# Patient Record
Sex: Female | Born: 1975 | Race: Asian | Hispanic: Yes | Marital: Married | Smoking: Never smoker
Health system: Southern US, Community
[De-identification: ages and names within clinical notes are randomized; demographics above are authoritative.]

## PROBLEM LIST (undated history)

## (undated) DIAGNOSIS — R002 Palpitations: Secondary | ICD-10-CM

## (undated) DIAGNOSIS — N920 Excessive and frequent menstruation with regular cycle: Secondary | ICD-10-CM

## (undated) DIAGNOSIS — E039 Hypothyroidism, unspecified: Secondary | ICD-10-CM

## (undated) DIAGNOSIS — E282 Polycystic ovarian syndrome: Secondary | ICD-10-CM

## (undated) DIAGNOSIS — E119 Type 2 diabetes mellitus without complications: Secondary | ICD-10-CM

## (undated) DIAGNOSIS — F32A Depression, unspecified: Secondary | ICD-10-CM

## (undated) DIAGNOSIS — F329 Major depressive disorder, single episode, unspecified: Secondary | ICD-10-CM

## (undated) DIAGNOSIS — Z9289 Personal history of other medical treatment: Secondary | ICD-10-CM

## (undated) DIAGNOSIS — D649 Anemia, unspecified: Secondary | ICD-10-CM

---

## 2012-03-14 ENCOUNTER — Ambulatory Visit (INDEPENDENT_AMBULATORY_CARE_PROVIDER_SITE_OTHER): Payer: 59 | Admitting: Internal Medicine

## 2012-03-14 VITALS — BP 113/74 | HR 84 | Temp 97.8°F | Resp 16 | Ht 63.75 in | Wt 221.6 lb

## 2012-03-14 DIAGNOSIS — N979 Female infertility, unspecified: Secondary | ICD-10-CM

## 2012-03-14 DIAGNOSIS — E039 Hypothyroidism, unspecified: Secondary | ICD-10-CM

## 2012-03-14 DIAGNOSIS — Z0000 Encounter for general adult medical examination without abnormal findings: Secondary | ICD-10-CM

## 2012-03-14 DIAGNOSIS — E282 Polycystic ovarian syndrome: Secondary | ICD-10-CM

## 2012-03-14 LAB — POCT CBC
Granulocyte percent: 55.9 %G (ref 37–80)
HCT, POC: 44.8 % (ref 37.7–47.9)
MCV: 90.6 fL (ref 80–97)
MID (cbc): 0.7 (ref 0–0.9)
Platelet Count, POC: 314 10*3/uL (ref 142–424)
RBC: 4.94 M/uL (ref 4.04–5.48)

## 2012-03-14 NOTE — Progress Notes (Signed)
  Subjective:    Patient ID: Veronica Kelly, female    DOB: 02/02/1976, 36 y.o.   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  HENT: Negative for hearing loss, congestion and dental problem.   Eyes: Negative for visual disturbance.  Respiratory: Negative for shortness of breath.   Cardiovascular: Negative.   Gastrointestinal: Negative.   Genitourinary: Negative for frequency and difficulty urinating.  Musculoskeletal: Negative for myalgias, joint swelling and gait problem.  Skin: Negative for rash.  Neurological: Negative.   Psychiatric/Behavioral: Negative.        Objective:   Physical Exam  Constitutional: She is oriented to person, place, and time.       In no acute distress Obese ---------------------------              03/14/12                     1742        ---------------------------  BP:          113/74        Pulse:         84          Temp:   97.8 F (36.6 C)  Resp:          16         ---------------------------   HENT:  Head: Normocephalic.  Right Ear: External ear normal.  Left Ear: External ear normal.  Nose: Nose normal.  Mouth/Throat: Oropharynx is clear and moist.  Eyes: Conjunctivae and EOM are normal. Pupils are equal, round, and reactive to light.  Neck: Neck supple. No thyromegaly present.  Cardiovascular: Normal rate, regular rhythm, normal heart sounds and intact distal pulses.   No murmur  heard. Pulmonary/Chest: Effort normal and breath sounds normal.  Abdominal: Soft. She exhibits no mass.  Musculoskeletal: Normal range of motion. She exhibits no edema.  Neurological: She is alert and oriented to person, place, and time. She has normal reflexes.  Skin: No rash noted.  Psychiatric: She has a normal mood and affect.       Assessment & Plan:  Impression #1 except for obesity her general health is good 1. Infertility, female  Progesterone  2. Hypothyroid  Lipid panel, TSH, T4, Free, POCT CBC, Comprehensive metabolic panel  3. PCOS (polycystic ovarian syndrome)  Lipid panel, Comprehensive metabolic panel   We'll contact with lab results Continue infertility workup

## 2012-03-15 LAB — COMPREHENSIVE METABOLIC PANEL
Albumin: 4.1 g/dL (ref 3.5–5.2)
Alkaline Phosphatase: 88 U/L (ref 39–117)
Glucose, Bld: 96 mg/dL (ref 70–99)
Potassium: 4.2 mEq/L (ref 3.5–5.3)
Sodium: 139 mEq/L (ref 135–145)
Total Protein: 7 g/dL (ref 6.0–8.3)

## 2012-03-15 LAB — LIPID PANEL
LDL Cholesterol: 60 mg/dL (ref 0–99)
Total CHOL/HDL Ratio: 4 Ratio
VLDL: 34 mg/dL (ref 0–40)

## 2012-03-15 LAB — T4, FREE: Free T4: 1.26 ng/dL (ref 0.80–1.80)

## 2012-03-15 LAB — TSH: TSH: 1.728 u[IU]/mL (ref 0.350–4.500)

## 2012-03-17 ENCOUNTER — Encounter: Payer: Self-pay | Admitting: Internal Medicine

## 2012-06-06 LAB — OB RESULTS CONSOLE RUBELLA ANTIBODY, IGM: Rubella: IMMUNE

## 2012-06-06 LAB — OB RESULTS CONSOLE ANTIBODY SCREEN: Antibody Screen: NEGATIVE

## 2012-06-06 LAB — OB RESULTS CONSOLE HIV ANTIBODY (ROUTINE TESTING): HIV: NONREACTIVE

## 2012-06-06 LAB — OB RESULTS CONSOLE RPR: RPR: NONREACTIVE

## 2012-06-16 ENCOUNTER — Inpatient Hospital Stay (HOSPITAL_COMMUNITY): Admission: AD | Admit: 2012-06-16 | Payer: Self-pay | Source: Ambulatory Visit | Admitting: Obstetrics & Gynecology

## 2012-06-16 LAB — OB RESULTS CONSOLE GC/CHLAMYDIA: Gonorrhea: NEGATIVE

## 2012-10-14 LAB — OB RESULTS CONSOLE RPR: RPR: NONREACTIVE

## 2012-12-14 ENCOUNTER — Inpatient Hospital Stay (HOSPITAL_COMMUNITY): Payer: 59

## 2012-12-14 ENCOUNTER — Inpatient Hospital Stay (HOSPITAL_COMMUNITY)
Admission: AD | Admit: 2012-12-14 | Discharge: 2012-12-19 | DRG: 766 | Disposition: A | Discharge status: Home / Self Care | Payer: 59 | Source: Ambulatory Visit | Attending: Obstetrics and Gynecology | Admitting: Obstetrics and Gynecology

## 2012-12-14 ENCOUNTER — Encounter (HOSPITAL_COMMUNITY): Payer: Self-pay | Admitting: *Deleted

## 2012-12-14 DIAGNOSIS — O4290 Premature rupture of membranes, unspecified as to length of time between rupture and onset of labor, unspecified weeks of gestation: Secondary | ICD-10-CM | POA: Diagnosis present

## 2012-12-14 DIAGNOSIS — E039 Hypothyroidism, unspecified: Secondary | ICD-10-CM | POA: Diagnosis present

## 2012-12-14 DIAGNOSIS — Z98891 History of uterine scar from previous surgery: Secondary | ICD-10-CM

## 2012-12-14 DIAGNOSIS — E079 Disorder of thyroid, unspecified: Secondary | ICD-10-CM | POA: Diagnosis present

## 2012-12-14 DIAGNOSIS — O24419 Gestational diabetes mellitus in pregnancy, unspecified control: Secondary | ICD-10-CM | POA: Diagnosis present

## 2012-12-14 DIAGNOSIS — O99814 Abnormal glucose complicating childbirth: Secondary | ICD-10-CM | POA: Diagnosis present

## 2012-12-14 DIAGNOSIS — O324XX0 Maternal care for high head at term, not applicable or unspecified: Secondary | ICD-10-CM | POA: Diagnosis present

## 2012-12-14 DIAGNOSIS — E669 Obesity, unspecified: Secondary | ICD-10-CM | POA: Diagnosis present

## 2012-12-14 DIAGNOSIS — O09519 Supervision of elderly primigravida, unspecified trimester: Secondary | ICD-10-CM | POA: Diagnosis present

## 2012-12-14 DIAGNOSIS — O99284 Endocrine, nutritional and metabolic diseases complicating childbirth: Secondary | ICD-10-CM | POA: Diagnosis present

## 2012-12-14 HISTORY — DX: Hypothyroidism, unspecified: E03.9

## 2012-12-14 HISTORY — DX: Polycystic ovarian syndrome: E28.2

## 2012-12-14 HISTORY — DX: Depression, unspecified: F32.A

## 2012-12-14 HISTORY — DX: Major depressive disorder, single episode, unspecified: F32.9

## 2012-12-14 LAB — CBC
Platelets: 251 10*3/uL (ref 150–400)
RBC: 4.52 MIL/uL (ref 3.87–5.11)
RDW: 15.1 % (ref 11.5–15.5)
WBC: 9.1 10*3/uL (ref 4.0–10.5)

## 2012-12-14 LAB — GLUCOSE, CAPILLARY
Glucose-Capillary: 102 mg/dL — ABNORMAL HIGH (ref 70–99)
Glucose-Capillary: 62 mg/dL — ABNORMAL LOW (ref 70–99)
Glucose-Capillary: 80 mg/dL (ref 70–99)
Glucose-Capillary: 80 mg/dL (ref 70–99)

## 2012-12-14 LAB — RPR: RPR Ser Ql: NONREACTIVE

## 2012-12-14 MED ORDER — FLEET ENEMA 7-19 GM/118ML RE ENEM: 1.0000 | ENEMA | Freq: Once | RECTAL | Status: DC

## 2012-12-14 MED ORDER — LACTATED RINGERS IV SOLN: 500.0000 mL | INTRAVENOUS | Status: DC | PRN

## 2012-12-14 MED ORDER — OXYTOCIN BOLUS FROM INFUSION: 500.0000 mL | INTRAVENOUS | Status: DC

## 2012-12-14 MED ORDER — EPHEDRINE 5 MG/ML INJ: 10.0000 mg | INTRAVENOUS | Status: DC | PRN

## 2012-12-14 MED ORDER — LACTATED RINGERS IV SOLN
INTRAVENOUS | Status: DC
  Administered 2012-12-14 (×6): via INTRAVENOUS

## 2012-12-14 MED ORDER — PHENYLEPHRINE 40 MCG/ML (10ML) SYRINGE FOR IV PUSH (FOR BLOOD PRESSURE SUPPORT)
80.0000 ug | PREFILLED_SYRINGE | INTRAVENOUS | Status: DC | PRN
  Filled 2012-12-14: qty 5

## 2012-12-14 MED ORDER — OXYTOCIN 40 UNITS IN LACTATED RINGERS INFUSION - SIMPLE MED: 62.5000 mL/h | INTRAVENOUS | Status: DC

## 2012-12-14 MED ORDER — BUTORPHANOL TARTRATE 1 MG/ML IJ SOLN
1.0000 mg | INTRAMUSCULAR | Status: DC | PRN
  Administered 2012-12-14 (×2): 1 mg via INTRAVENOUS
  Filled 2012-12-14 (×2): qty 1

## 2012-12-14 MED ORDER — LACTATED RINGERS IV SOLN: 500.0000 mL | Freq: Once | INTRAVENOUS | Status: DC

## 2012-12-14 MED ORDER — LIDOCAINE HCL (PF) 1 % IJ SOLN
INTRAMUSCULAR | Status: DC | PRN
  Administered 2012-12-14 (×4): 4 mL

## 2012-12-14 MED ORDER — IBUPROFEN 600 MG PO TABS: 600.0000 mg | ORAL_TABLET | Freq: Four times a day (QID) | ORAL | Status: DC | PRN

## 2012-12-14 MED ORDER — ACETAMINOPHEN 325 MG PO TABS: 650.0000 mg | ORAL_TABLET | ORAL | Status: DC | PRN

## 2012-12-14 MED ORDER — MISOPROSTOL 25 MCG QUARTER TABLET
50.0000 ug | ORAL_TABLET | Freq: Once | ORAL | Status: AC
  Administered 2012-12-14: 50 ug via ORAL
  Filled 2012-12-14: qty 0.5

## 2012-12-14 MED ORDER — ONDANSETRON HCL 4 MG/2ML IJ SOLN: 4.0000 mg | Freq: Four times a day (QID) | INTRAMUSCULAR | Status: DC | PRN

## 2012-12-14 MED ORDER — DIPHENHYDRAMINE HCL 50 MG/ML IJ SOLN: 12.5000 mg | INTRAMUSCULAR | Status: DC | PRN

## 2012-12-14 MED ORDER — LIDOCAINE HCL (PF) 1 % IJ SOLN: 30.0000 mL | INTRAMUSCULAR | Status: DC | PRN

## 2012-12-14 MED ORDER — FENTANYL 2.5 MCG/ML BUPIVACAINE 1/10 % EPIDURAL INFUSION (WH - ANES)
14.0000 mL/h | INTRAMUSCULAR | Status: DC | PRN
  Administered 2012-12-14 (×2): 14 mL/h via EPIDURAL
  Filled 2012-12-14 (×2): qty 125

## 2012-12-14 MED ORDER — CITRIC ACID-SODIUM CITRATE 334-500 MG/5ML PO SOLN
30.0000 mL | ORAL | Status: DC | PRN
  Administered 2012-12-15: 30 mL via ORAL
  Filled 2012-12-14: qty 15

## 2012-12-14 MED ORDER — EPHEDRINE 5 MG/ML INJ
10.0000 mg | INTRAVENOUS | Status: DC | PRN
  Filled 2012-12-14: qty 4

## 2012-12-14 MED ORDER — OXYCODONE-ACETAMINOPHEN 5-325 MG PO TABS: 1.0000 | ORAL_TABLET | ORAL | Status: DC | PRN

## 2012-12-14 MED ORDER — PHENYLEPHRINE 40 MCG/ML (10ML) SYRINGE FOR IV PUSH (FOR BLOOD PRESSURE SUPPORT): 80.0000 ug | PREFILLED_SYRINGE | INTRAVENOUS | Status: DC | PRN

## 2012-12-14 NOTE — H&P (Signed)
Veronica Kelly is a 37 y.o. female G1 at 37 wks, A2GDM v/s possible Class B DM(pregestational). Presented with ROM, clear fluid since 3 am. No bleeding. Went to work yesterday after office visit. Good FMs.    History [facility] Dr Miley [facility]/[place] since 8 wks. PCOS pt, long term infertility, on Metformin pre-pregnancy Glyburide added for GDM management. No preeclampsia.  Total wt gain 20 lbs.  Nl fetal anatomy scan and interval growth sono. Last sono on 12/13/12 EFW 6'9" at 66%, AC at 55%, VTX. AFI 17 cm.  Antenatal testing since 34 wks, nl BPP.  Due to possible pre-gestational DM, she had fetal echo that was normal.  Hypothyroidism, well controlled in pregnancy.  TB stable, treated in past, no cough/wt loss.   OB History   Grav Para Term Preterm Abortions TAB SAB Ect Mult Living   1              Past Medical History  Diagnosis Date  . Hypothyroidism   . Diabetes mellitus without complication   . PCOS (polycystic ovarian syndrome)   . Depression   . Tuberculosis    History reviewed. No pertinent past surgical history. Family History: family history is not on file. Social History:  reports that she has never smoked. She does not have any smokeless tobacco history on file. She reports that she does not drink alcohol or use illicit drugs.   Prenatal Transfer Tool  Maternal Diabetes: Yes:  Diabetes Type:  Pre-pregnancy, possible GDM (on Metformin and Glyburide) Genetic Screening: Normal Maternal Ultrasounds/Referrals: Normal Fetal Ultrasounds or other Referrals:  Fetal echo -normal Maternal Substance Abuse:  No Significant Maternal Medications:  Meds include: Syntroid Other:  Glyburide (2.5 mg at bedtime), Metformin (1000 mg bid)  Significant Maternal Lab Results:  Lab values include: Group B Strep negative  Review of Systems  Constitutional: Negative for fever.  Eyes: Negative for blurred vision.  Respiratory: Negative for cough.   Cardiovascular: Negative for chest pain.   Gastrointestinal: Negative for heartburn.  Skin: Negative for rash.  Neurological: Negative for headaches.  Psychiatric/Behavioral: Negative for depression.    Dilation: 2 Effacement (%): 80 Station: -2 Exam by:: Hardin Negus CC RN Blood pressure 107/68, pulse 105, temperature 97.6 F (36.4 C), temperature source Oral, resp. rate 20, height 5\' 6"  (1.676 m), weight 239 lb 4 oz (108.523 kg), last menstrual period 02/20/2012. Exam Physical Exam   A&O x 3, no acute distress. Pleasant HEENT neg, no thyromegaly Lungs CTA bilat CV RRR, A1S2 normal Abdo soft, non tender, non acute Extr no edema/ tenderness Pelvic per RN exam above FHT  140s/ + accels/ + variable decels with late component/ moderate variability- overall category I and intermittent II (improved with Left lat, O2 mask) Toco since PO CYtotec at 5.30 am, she has regular q 3-5 min contractions.   Prenatal labs: ABO, Rh: A/Positive/-- (10/21 0000) Antibody: Negative (10/21 0000) Rubella: Immune (10/21 0000) RPR: Nonreactive (02/28 0000)  HBsAg: Negative (10/21 0000)  HIV: Non-reactive (10/21 0000)  GBS:   Negative Glucola abn- GDM vs Prepreg DM (classB) Thyroid panel normal each trimester Genetic screen  - Nl Ultrascreen and AFP1    Assessment/Plan: 37 yo, G1 at 37 wks, A2GDM or Class B DM, ruptured membranes since 3 am. Clear fluid. IOL with PO Cytotec at 5.30 am, 1 dose. Contracting well.  FHT- category I and II at times, needs continuous monitoring, left lat, hydrate w IV bolus. Will hold pitocin until UCs space out and if  FHT stable. EFW 7 lbs.  DM (GDM-A2 or class B)- Check BS every 4 hrs now and q 2 hrs in active labor Hypothyroid- cont meds PP Obesity Veronica Kelly R 12/14/2012, 12:23 PM

## 2012-12-14 NOTE — MAU Note (Signed)
Pt states at 0245 she started leaking fluid-clear

## 2012-12-14 NOTE — Progress Notes (Signed)
Veronica Kelly is a 37 y.o. G1P0 at [redacted]w[redacted]d SROM at 3 am. GDM-A2 vs B. BS nl since in labor. Active labor since PO Cytotec this AM.  Subjective: Better since epidural   Objective: BP 105/59  Pulse 93  Temp(Src) 97.4 F (36.3 C) (Oral)  Resp 20  Ht 5\' 6"  (1.676 m)  Wt 239 lb 4 oz (108.523 kg)  BMI 38.63 kg/m2  SpO2 99%  LMP 02/20/2012     FHT:  FHR: 145 bpm, variability: moderate,  accelerations:  Present,  decelerations:  Present occasional variable decels with late component UC:   regular, every 3 minutes SVE:  7/100%/-1/Vtx. IUPC placed.   Labs: Lab Results  Component Value Date   WBC 9.1 12/14/2012   HGB 12.8 12/14/2012   HCT 37.4 12/14/2012   MCV 82.7 12/14/2012   PLT 251 12/14/2012    Assessment / Plan: Spontaneous labor, progressing normally  Labor: Progressing normally, awaiting descent. Fetal Wellbeing:  Category I Pain Control:  Epidural Anticipated MOD:  NSVD  Veronica Kelly 12/14/2012, 7:28 PM

## 2012-12-14 NOTE — Anesthesia Preprocedure Evaluation (Addendum)
Anesthesia Evaluation  Patient identified by MRN, date of birth, ID band Patient awake    Reviewed: Allergy & Precautions, H&P , NPO status , Patient's Chart, lab work & pertinent test results, reviewed documented beta blocker date and time   History of Anesthesia Complications Negative for: history of anesthetic complications  Airway Mallampati: II TM Distance: >3 FB Neck ROM: full    Dental  (+) Teeth Intact   Pulmonary  H/o TB breath sounds clear to auscultation        Cardiovascular negative cardio ROS  Rhythm:regular Rate:Normal     Neuro/Psych PSYCHIATRIC DISORDERS (depression - no meds) negative neurological ROS     GI/Hepatic negative GI ROS, Neg liver ROS,   Endo/Other  diabetes, Oral Hypoglycemic AgentsHypothyroidism Morbid obesity (BMI 38.7)PCOS  Renal/GU negative Renal ROS     Musculoskeletal   Abdominal   Peds  Hematology negative hematology ROS (+)   Anesthesia Other Findings   Reproductive/Obstetrics (+) Pregnancy                           Anesthesia Physical Anesthesia Plan  ASA: III and emergent  Anesthesia Plan: Epidural   Post-op Pain Management:    Induction:   Airway Management Planned:   Additional Equipment:   Intra-op Plan:   Post-operative Plan:   Informed Consent: I have reviewed the patients History and Physical, chart, labs and discussed the procedure including the risks, benefits and alternatives for the proposed anesthesia with the patient or authorized representative who has indicated his/her understanding and acceptance.     Plan Discussed with:   Anesthesia Plan Comments:        Anesthesia Quick Evaluation

## 2012-12-14 NOTE — Progress Notes (Signed)
Veronica Kelly is a 37 y.o. G1P0 at [redacted]w[redacted]d, A2/B GDM, hypothyroid. SROM since 3 am on 4/30.   Objective: BP 118/82  Pulse 125  Temp(Src) 98 F (36.7 C) (Oral)  Resp 20  Ht 5\' 6"  (1.676 m)  Wt 239 lb 4 oz (108.523 kg)  BMI 38.63 kg/m2  SpO2 99%  LMP 02/20/2012    FHT:  FHR: 145 bpm, variability: moderate,  accelerations:  Present,  decelerations:  Absent UC:   regular, every 3 minutes SVE:   Dilation: 10 Effacement (%): 100 Station: +1 Exam by:: foley,rn MD exam- complete since around 9.30 pm, station at 0, tight fit in pelvis due to convergent lateral walls, hematuria noted.   Labs: Lab Results  Component Value Date   WBC 9.1 12/14/2012   HGB 12.8 12/14/2012   HCT 37.4 12/14/2012   MCV 82.7 12/14/2012   PLT 251 12/14/2012    Assessment / Plan: Arrest of decent  Labor: No progress of descent since my exam at 6 pm.  now complete and at 0 station since 9.30- 10 pm.  Fetal Wellbeing:  Category I Pain Control:  Epidural Anticipated MOD:  very likely cesarean Pt counseled, incl risks/complications, understands and agrees.   Labron Bloodgood R 12/14/2012, 11:43 PM

## 2012-12-14 NOTE — Anesthesia Procedure Notes (Signed)
Epidural Patient location during procedure: OB Start time: 12/14/2012 3:28 PM  Staffing Performed by: anesthesiologist   Preanesthetic Checklist Completed: patient identified, site marked, surgical consent, pre-op evaluation, timeout performed, IV checked, risks and benefits discussed and monitors and equipment checked  Epidural Patient position: sitting Prep: site prepped and draped and DuraPrep Patient monitoring: continuous pulse ox and blood pressure Approach: midline Injection technique: LOR air  Needle:  Needle type: Tuohy  Needle gauge: 17 G Needle length: 9 cm and 9 Needle insertion depth: 8 cm Catheter type: closed end flexible Catheter size: 19 Gauge Catheter at skin depth: 13 cm Test dose: negative  Assessment Events: blood not aspirated, injection not painful, no injection resistance, negative IV test and no paresthesia  Additional Notes Discussed risk of headache, infection, bleeding, nerve injury and failed or incomplete block.  Patient voices understanding and wishes to proceed.  Epidural placed easily on first attempt.  No paresthesia.  Patient tolerated procedure well with no apparent complications.  Jasmine December, MD Reason for block:procedure for pain

## 2012-12-15 ENCOUNTER — Encounter (HOSPITAL_COMMUNITY): Payer: Self-pay | Admitting: Anesthesiology

## 2012-12-15 ENCOUNTER — Encounter (HOSPITAL_COMMUNITY): Payer: Self-pay | Admitting: Obstetrics & Gynecology

## 2012-12-15 ENCOUNTER — Encounter (HOSPITAL_COMMUNITY)
Admission: AD | Disposition: A | Discharge status: Home / Self Care | Payer: Self-pay | Source: Ambulatory Visit | Attending: Obstetrics and Gynecology

## 2012-12-15 ENCOUNTER — Inpatient Hospital Stay (HOSPITAL_COMMUNITY): Payer: 59 | Admitting: Anesthesiology

## 2012-12-15 DIAGNOSIS — Z98891 History of uterine scar from previous surgery: Secondary | ICD-10-CM

## 2012-12-15 DIAGNOSIS — E669 Obesity, unspecified: Secondary | ICD-10-CM | POA: Diagnosis present

## 2012-12-15 DIAGNOSIS — O4290 Premature rupture of membranes, unspecified as to length of time between rupture and onset of labor, unspecified weeks of gestation: Secondary | ICD-10-CM | POA: Diagnosis present

## 2012-12-15 DIAGNOSIS — O24419 Gestational diabetes mellitus in pregnancy, unspecified control: Secondary | ICD-10-CM | POA: Diagnosis present

## 2012-12-15 DIAGNOSIS — E039 Hypothyroidism, unspecified: Secondary | ICD-10-CM | POA: Diagnosis present

## 2012-12-15 LAB — CBC
HCT: 32.4 % — ABNORMAL LOW (ref 36.0–46.0)
Hemoglobin: 10.9 g/dL — ABNORMAL LOW (ref 12.0–15.0)
MCH: 28.1 pg (ref 26.0–34.0)
MCHC: 33.6 g/dL (ref 30.0–36.0)
MCV: 83.5 fL (ref 78.0–100.0)
RBC: 3.88 MIL/uL (ref 3.87–5.11)

## 2012-12-15 LAB — TYPE AND SCREEN
ABO/RH(D): A POS
Antibody Screen: NEGATIVE

## 2012-12-15 LAB — ABO/RH: ABO/RH(D): A POS

## 2012-12-15 LAB — GLUCOSE, CAPILLARY

## 2012-12-15 SURGERY — Surgical Case
Anesthesia: Epidural | Site: Abdomen | Wound class: Clean Contaminated

## 2012-12-15 MED ORDER — FENTANYL CITRATE 0.05 MG/ML IJ SOLN: 25.0000 ug | INTRAMUSCULAR | Status: DC | PRN

## 2012-12-15 MED ORDER — ONDANSETRON HCL 4 MG/2ML IJ SOLN
INTRAMUSCULAR | Status: DC | PRN
  Administered 2012-12-15: 4 mg via INTRAVENOUS

## 2012-12-15 MED ORDER — ZOLPIDEM TARTRATE 5 MG PO TABS: 5.0000 mg | ORAL_TABLET | Freq: Every evening | ORAL | Status: DC | PRN

## 2012-12-15 MED ORDER — KETOROLAC TROMETHAMINE 30 MG/ML IJ SOLN
30.0000 mg | Freq: Four times a day (QID) | INTRAMUSCULAR | Status: DC | PRN
  Administered 2012-12-15: 30 mg via INTRAVENOUS
  Filled 2012-12-15: qty 1

## 2012-12-15 MED ORDER — WITCH HAZEL-GLYCERIN EX PADS: 1.0000 "application " | MEDICATED_PAD | CUTANEOUS | Status: DC | PRN

## 2012-12-15 MED ORDER — ONDANSETRON HCL 4 MG PO TABS: 4.0000 mg | ORAL_TABLET | ORAL | Status: DC | PRN

## 2012-12-15 MED ORDER — OXYTOCIN 10 UNIT/ML IJ SOLN
INTRAMUSCULAR | Status: AC
  Filled 2012-12-15: qty 4

## 2012-12-15 MED ORDER — OXYCODONE-ACETAMINOPHEN 5-325 MG PO TABS
1.0000 | ORAL_TABLET | ORAL | Status: DC | PRN
  Administered 2012-12-17 – 2012-12-19 (×5): 1 via ORAL
  Filled 2012-12-15: qty 1
  Filled 2012-12-15: qty 2
  Filled 2012-12-15 (×3): qty 1

## 2012-12-15 MED ORDER — NALOXONE HCL 0.4 MG/ML IJ SOLN: 0.4000 mg | INTRAMUSCULAR | Status: DC | PRN

## 2012-12-15 MED ORDER — DIPHENHYDRAMINE HCL 50 MG/ML IJ SOLN: 25.0000 mg | INTRAMUSCULAR | Status: DC | PRN

## 2012-12-15 MED ORDER — PNEUMOCOCCAL VAC POLYVALENT 25 MCG/0.5ML IJ INJ
0.5000 mL | INJECTION | INTRAMUSCULAR | Status: AC
  Administered 2012-12-16: 0.5 mL via INTRAMUSCULAR
  Filled 2012-12-15 (×2): qty 0.5

## 2012-12-15 MED ORDER — DEXTROSE 5 % IV SOLN
1.0000 ug/kg/h | INTRAVENOUS | Status: DC | PRN
  Filled 2012-12-15: qty 2

## 2012-12-15 MED ORDER — LACTATED RINGERS IV SOLN
INTRAVENOUS | Status: DC | PRN
  Administered 2012-12-15 (×4): via INTRAVENOUS

## 2012-12-15 MED ORDER — DIPHENHYDRAMINE HCL 25 MG PO CAPS: 25.0000 mg | ORAL_CAPSULE | ORAL | Status: DC | PRN

## 2012-12-15 MED ORDER — PHENYLEPHRINE 40 MCG/ML (10ML) SYRINGE FOR IV PUSH (FOR BLOOD PRESSURE SUPPORT)
PREFILLED_SYRINGE | INTRAVENOUS | Status: AC
  Filled 2012-12-15: qty 5

## 2012-12-15 MED ORDER — LACTATED RINGERS IV SOLN
INTRAVENOUS | Status: DC | PRN
  Administered 2012-12-15 (×2): via INTRAVENOUS

## 2012-12-15 MED ORDER — ONDANSETRON HCL 4 MG/2ML IJ SOLN
INTRAMUSCULAR | Status: AC
  Filled 2012-12-15: qty 2

## 2012-12-15 MED ORDER — SENNOSIDES-DOCUSATE SODIUM 8.6-50 MG PO TABS
2.0000 | ORAL_TABLET | Freq: Every day | ORAL | Status: DC
  Administered 2012-12-15 – 2012-12-16 (×2): 2 via ORAL

## 2012-12-15 MED ORDER — ONDANSETRON HCL 4 MG/2ML IJ SOLN: 4.0000 mg | INTRAMUSCULAR | Status: DC | PRN

## 2012-12-15 MED ORDER — CEFAZOLIN SODIUM-DEXTROSE 2-3 GM-% IV SOLR
2.0000 g | INTRAVENOUS | Status: DC
  Filled 2012-12-15: qty 50

## 2012-12-15 MED ORDER — ONDANSETRON HCL 4 MG/2ML IJ SOLN: 4.0000 mg | Freq: Three times a day (TID) | INTRAMUSCULAR | Status: DC | PRN

## 2012-12-15 MED ORDER — METOCLOPRAMIDE HCL 5 MG/ML IJ SOLN: 10.0000 mg | Freq: Three times a day (TID) | INTRAMUSCULAR | Status: DC | PRN

## 2012-12-15 MED ORDER — LANOLIN HYDROUS EX OINT: 1.0000 "application " | TOPICAL_OINTMENT | CUTANEOUS | Status: DC | PRN

## 2012-12-15 MED ORDER — MORPHINE SULFATE (PF) 0.5 MG/ML IJ SOLN
INTRAMUSCULAR | Status: DC | PRN
  Administered 2012-12-15: 3 mg via EPIDURAL

## 2012-12-15 MED ORDER — SCOPOLAMINE 1 MG/3DAYS TD PT72
MEDICATED_PATCH | TRANSDERMAL | Status: AC
  Filled 2012-12-15: qty 1

## 2012-12-15 MED ORDER — NALBUPHINE SYRINGE 5 MG/0.5 ML
5.0000 mg | INJECTION | INTRAMUSCULAR | Status: DC | PRN
  Filled 2012-12-15: qty 1

## 2012-12-15 MED ORDER — OXYTOCIN 40 UNITS IN LACTATED RINGERS INFUSION - SIMPLE MED
62.5000 mL/h | INTRAVENOUS | Status: DC
  Administered 2012-12-15: 62.5 mL/h via INTRAVENOUS
  Filled 2012-12-15: qty 1000

## 2012-12-15 MED ORDER — METFORMIN HCL 500 MG PO TABS
500.0000 mg | ORAL_TABLET | Freq: Two times a day (BID) | ORAL | Status: DC
  Administered 2012-12-15 – 2012-12-16 (×3): 500 mg via ORAL
  Filled 2012-12-15 (×3): qty 1

## 2012-12-15 MED ORDER — EPHEDRINE 5 MG/ML INJ
INTRAVENOUS | Status: AC
  Filled 2012-12-15: qty 10

## 2012-12-15 MED ORDER — CEFAZOLIN SODIUM-DEXTROSE 2-3 GM-% IV SOLR
INTRAVENOUS | Status: DC | PRN
  Administered 2012-12-15: 2 g via INTRAVENOUS

## 2012-12-15 MED ORDER — IBUPROFEN 600 MG PO TABS
600.0000 mg | ORAL_TABLET | Freq: Four times a day (QID) | ORAL | Status: DC
  Administered 2012-12-15 – 2012-12-19 (×13): 600 mg via ORAL
  Filled 2012-12-15 (×13): qty 1

## 2012-12-15 MED ORDER — DIPHENHYDRAMINE HCL 50 MG/ML IJ SOLN: 12.5000 mg | INTRAMUSCULAR | Status: DC | PRN

## 2012-12-15 MED ORDER — KETOROLAC TROMETHAMINE 30 MG/ML IJ SOLN
INTRAMUSCULAR | Status: AC
  Filled 2012-12-15: qty 1

## 2012-12-15 MED ORDER — PHENYLEPHRINE HCL 10 MG/ML IJ SOLN
INTRAMUSCULAR | Status: DC | PRN
  Administered 2012-12-15: 80 ug via INTRAVENOUS

## 2012-12-15 MED ORDER — OXYTOCIN 10 UNIT/ML IJ SOLN
40.0000 [IU] | INTRAVENOUS | Status: DC | PRN
  Administered 2012-12-15: 40 [IU] via INTRAVENOUS

## 2012-12-15 MED ORDER — SODIUM BICARBONATE 8.4 % IV SOLN
INTRAVENOUS | Status: DC | PRN
  Administered 2012-12-15: 5 mL via EPIDURAL

## 2012-12-15 MED ORDER — LEVOTHYROXINE SODIUM 50 MCG PO TABS
50.0000 ug | ORAL_TABLET | Freq: Every day | ORAL | Status: DC
  Administered 2012-12-15 – 2012-12-18 (×4): 50 ug via ORAL
  Filled 2012-12-15 (×5): qty 1

## 2012-12-15 MED ORDER — PRENATAL MULTIVITAMIN CH
1.0000 | ORAL_TABLET | Freq: Every day | ORAL | Status: DC
  Administered 2012-12-15 – 2012-12-19 (×5): 1 via ORAL
  Filled 2012-12-15 (×5): qty 1

## 2012-12-15 MED ORDER — SIMETHICONE 80 MG PO CHEW: 80.0000 mg | CHEWABLE_TABLET | ORAL | Status: DC | PRN

## 2012-12-15 MED ORDER — MENTHOL 3 MG MT LOZG: 1.0000 | LOZENGE | OROMUCOSAL | Status: DC | PRN

## 2012-12-15 MED ORDER — SCOPOLAMINE 1 MG/3DAYS TD PT72
1.0000 | MEDICATED_PATCH | Freq: Once | TRANSDERMAL | Status: AC
  Administered 2012-12-15: 1.5 mg via TRANSDERMAL

## 2012-12-15 MED ORDER — SIMETHICONE 80 MG PO CHEW
80.0000 mg | CHEWABLE_TABLET | Freq: Three times a day (TID) | ORAL | Status: DC
  Administered 2012-12-15 – 2012-12-18 (×11): 80 mg via ORAL

## 2012-12-15 MED ORDER — SODIUM CHLORIDE 0.9 % IJ SOLN: 3.0000 mL | INTRAMUSCULAR | Status: DC | PRN

## 2012-12-15 MED ORDER — DIBUCAINE 1 % RE OINT: 1.0000 "application " | TOPICAL_OINTMENT | RECTAL | Status: DC | PRN

## 2012-12-15 MED ORDER — MORPHINE SULFATE 0.5 MG/ML IJ SOLN
INTRAMUSCULAR | Status: AC
  Filled 2012-12-15: qty 10

## 2012-12-15 MED ORDER — KETOROLAC TROMETHAMINE 30 MG/ML IJ SOLN: 30.0000 mg | Freq: Four times a day (QID) | INTRAMUSCULAR | Status: DC | PRN

## 2012-12-15 MED ORDER — MEPERIDINE HCL 25 MG/ML IJ SOLN: 6.2500 mg | INTRAMUSCULAR | Status: DC | PRN

## 2012-12-15 MED ORDER — DIPHENHYDRAMINE HCL 25 MG PO CAPS: 25.0000 mg | ORAL_CAPSULE | Freq: Four times a day (QID) | ORAL | Status: DC | PRN

## 2012-12-15 MED ORDER — LACTATED RINGERS IV SOLN: INTRAVENOUS | Status: DC

## 2012-12-15 SURGICAL SUPPLY — 42 items
BENZOIN TINCTURE PRP APPL 2/3 (GAUZE/BANDAGES/DRESSINGS) IMPLANT
CLOTH BEACON ORANGE TIMEOUT ST (SAFETY) ×2 IMPLANT
CONTAINER PREFILL 10% NBF 15ML (MISCELLANEOUS) IMPLANT
DRAPE LG THREE QUARTER DISP (DRAPES) ×2 IMPLANT
DRSG OPSITE POSTOP 4X10 (GAUZE/BANDAGES/DRESSINGS) ×2 IMPLANT
DURAPREP 26ML APPLICATOR (WOUND CARE) ×2 IMPLANT
ELECT REM PT RETURN 9FT ADLT (ELECTROSURGICAL) ×2
ELECTRODE REM PT RTRN 9FT ADLT (ELECTROSURGICAL) ×1 IMPLANT
EXTRACTOR VACUUM KIWI (MISCELLANEOUS) IMPLANT
EXTRACTOR VACUUM M CUP 4 TUBE (SUCTIONS) IMPLANT
GLOVE BIO SURGEON STRL SZ7 (GLOVE) IMPLANT
GLOVE BIOGEL PI IND STRL 7.0 (GLOVE) ×2 IMPLANT
GLOVE BIOGEL PI IND STRL 7.5 (GLOVE) ×1 IMPLANT
GLOVE BIOGEL PI INDICATOR 7.0 (GLOVE) ×2
GLOVE BIOGEL PI INDICATOR 7.5 (GLOVE) ×1
GLOVE SURG SS PI 7.0 STRL IVOR (GLOVE) ×2 IMPLANT
GLOVE SURG SS PI 7.5 STRL IVOR (GLOVE) ×2 IMPLANT
GOWN STRL REIN XL XLG (GOWN DISPOSABLE) ×4 IMPLANT
KIT ABG SYR 3ML LUER SLIP (SYRINGE) IMPLANT
NEEDLE HYPO 25X5/8 SAFETYGLIDE (NEEDLE) IMPLANT
NS IRRIG 1000ML POUR BTL (IV SOLUTION) ×2 IMPLANT
PACK C SECTION WH (CUSTOM PROCEDURE TRAY) ×2 IMPLANT
PAD ABD 7.5X8 STRL (GAUZE/BANDAGES/DRESSINGS) ×2 IMPLANT
PAD OB MATERNITY 4.3X12.25 (PERSONAL CARE ITEMS) ×2 IMPLANT
RTRCTR C-SECT PINK 25CM LRG (MISCELLANEOUS) ×2 IMPLANT
STAPLER VISISTAT 35W (STAPLE) ×2 IMPLANT
STRIP CLOSURE SKIN 1/4X4 (GAUZE/BANDAGES/DRESSINGS) IMPLANT
SUT MNCRL 0 VIOLET CTX 36 (SUTURE) ×3 IMPLANT
SUT MONOCRYL 0 CTX 36 (SUTURE) ×3
SUT PLAIN 0 NONE (SUTURE) IMPLANT
SUT PLAIN 2 0 (SUTURE)
SUT PLAIN 2 0 XLH (SUTURE) ×2 IMPLANT
SUT PLAIN ABS 2-0 CT1 27XMFL (SUTURE) IMPLANT
SUT VIC AB 0 CT1 27 (SUTURE) ×2
SUT VIC AB 0 CT1 27XBRD ANBCTR (SUTURE) ×2 IMPLANT
SUT VIC AB 2-0 CT1 27 (SUTURE) ×2
SUT VIC AB 2-0 CT1 TAPERPNT 27 (SUTURE) ×2 IMPLANT
SUT VIC AB 4-0 KS 27 (SUTURE) IMPLANT
SUT VICRYL 0 TIES 12 18 (SUTURE) IMPLANT
TOWEL OR 17X24 6PK STRL BLUE (TOWEL DISPOSABLE) ×6 IMPLANT
TRAY FOLEY CATH 14FR (SET/KITS/TRAYS/PACK) IMPLANT
WATER STERILE IRR 1000ML POUR (IV SOLUTION) ×2 IMPLANT

## 2012-12-15 NOTE — Transfer of Care (Signed)
Immediate Anesthesia Transfer of Care Note  Patient: Veronica Kelly  Procedure(s) Performed: Procedure(s): CESAREAN SECTION (N/A)  Patient Location: PACU  Anesthesia Type:Epidural  Level of Consciousness: awake, alert , oriented and patient cooperative  Airway & Oxygen Therapy: Patient Spontanous Breathing  Post-op Assessment: Report given to PACU RN and Post -op Vital signs reviewed and stable  Post vital signs: Reviewed and stable  Complications: No apparent anesthesia complications

## 2012-12-15 NOTE — Progress Notes (Signed)
POSTOPERATIVE DAY # 0 S/P CS   S:         Reports feeling ok             Tolerating po intake / + nausea / + vomiting after surgery / no flatus / no  BM             Bleeding is light             Pain controlled with long acting narcotic             Up ad lib / ambulatory/ voiding QS  Newborn in NICU - breast feeding planned - planning pumping today     O:  VS: BP 105/54  Pulse 85  Temp(Src) 97.7 F (36.5 C) (Oral)  Resp 14  Ht 5\' 6"  (1.676 m)  Wt 108.523 kg (239 lb 4 oz)  BMI 38.63 kg/m2  SpO2 98%  LMP 02/20/2012   LABS:  Recent Labs  12/14/12 0510 12/15/12 0600  WBC 9.1 15.0*  HGB 12.8 10.9*  PLT 251 230                           I&O: Intake/Output     04/30 0701 - 05/01 0700 05/01 0701 - 05/02 0700   I.V. (mL/kg) 3100 (28.6)    Total Intake(mL/kg) 3100 (28.6)    Urine (mL/kg/hr) 475 (0.2)    Blood 700 (0.3)    Total Output 1175     Net +1925                       Physical Exam:             Alert and Oriented X3  Lungs: Clear and unlabored  Heart: regular rate and rhythm / no mumurs  Abdomen: soft, non-tender, non-distended, hypoactive BS             Fundus: firm, non-tender, Ueven             Dressing intact             Perineum: no edema  Lochia: light  Extremities: trace edema, no calf pain or tenderness  A:        POD # 0 S/P CS              P:        Routine postoperative care              Advance diet as tolerated     Veronica Kelly CNM, MSN 12/15/2012, 10:07 AM

## 2012-12-15 NOTE — Anesthesia Postprocedure Evaluation (Signed)
  Anesthesia Post-op Note  Patient: Veronica Kelly  Procedure(s) Performed: Procedure(s): CESAREAN SECTION (N/A)  Patient Location: PACU  Anesthesia Type:Epidural  Level of Consciousness: awake, alert  and oriented  Airway and Oxygen Therapy: Patient Spontanous Breathing  Post-op Pain: none  Post-op Assessment: Post-op Vital signs reviewed, Patient's Cardiovascular Status Stable, Respiratory Function Stable, Patent Airway, No signs of Nausea or vomiting, Pain level controlled, No headache and No backache  Post-op Vital Signs: Reviewed and stable  Complications: No apparent anesthesia complications

## 2012-12-15 NOTE — Preoperative (Signed)
Beta Blockers   Reason not to administer Beta Blockers:Not Applicable 

## 2012-12-15 NOTE — Op Note (Signed)
Procedure Note  Veronica Kelly  12/15/2012  Procedure: Primary Low Transverse Cesarean Section  Indications: Arrest of descent, 0 station, persistent OP position  Pre-operative Diagnosis: 37 wks, ruptured membranes for 22 hrs, A2/B Gestational diabetes, Hypothyroidism                                              Failure to Descend in stage II  Post-operative Diagnosis: Same , OP position  Surgeon: Robley Fries, MD   Assistants: None  Anesthesia: epidural   Procedure Details:  The patient was seen in the Labor Room. She progressed well in active phase but there was arrest of descent in second stage of labor, station at 0 and hematuria noted. Cesarean delivery was recommended. The risks, benefits, complications, treatment options, and expected outcomes were discussed with the patient. The patient concurred with the proposed plan, giving informed consent. identified as Veronica Kelly and the procedure verified as C-Section Delivery. A Time Out was held and the above information confirmed. 2 gm Ancef given prior to starting surgery.  After induction of anesthesia, the patient was prepped and draped in the usual sterile manner. A transverse was made and carried down through the subcutaneous tissue to the fascia. Fascial incision was made and extended transversely. The fascia was separated from the underlying rectus tissue superiorly and inferiorly. The peritoneum was identified and entered. Peritoneal incision was extended longitudinally. Alexis-O retractor was inserted carefully and secured in place after clearing bowel with most pack. The utero-vesical peritoneal reflection was incised transversely and the bladder flap was bluntly freed from the lower uterine segment. A low transverse uterine incision was made. Delivered from cephalic presentation, OP position was a FEMALE infant at 1 am on 12/15/12, loose nuchal cord reduced, cord clamped and cut and infant handed to NICU team in attendance. Apgar  scores of 7 at one minute and 8 at five minutes. Cord ph was not sent . The placenta was removed Intact and appeared normal. The uterine outline, tubes and ovaries appeared normal. The uterine incision was closed with running locked sutures of 0Vicryl followed by a second imbricating layer.  Hemostasis was observed. Lavage was carried out until clear. Peritoneum closed with 2-0 Vicryl.  The fascia was then reapproximated with running sutures of 0Vicryl. The subcutaneous fat closure was performed using 2-0plain gut. T he skin was closed with staples due to maternal o Sterile dressing placed.  Instrument, sponge, and needle counts were correct prior the abdominal closure and were correct at the conclusion of the case.   Findings: FEMALE infant, OP position, station high. Delivered at 1 am on 12/15/12. Apgars 7 and 8 at 1/ 5 minutes. Loose nuchal cord noted. Weight pending.   Estimated Blood Loss: 700 cc  Total IV Fluids: 2500 ml   Urine Output: 50CC OF pink colored urine, hematuria resolved.   Specimens: Placenta to path and cord blood   Complications: no complications  Disposition: PACU - hemodynamically stable.   Maternal Condition: stable   Baby condition / location:  with mother, stable  Attending Attestation: I was present and scrubbed for the entire procedure.   Signed: Surgeon(s): Robley Fries, MD

## 2012-12-15 NOTE — Anesthesia Postprocedure Evaluation (Signed)
  Anesthesia Post-op Note  Patient: Veronica Kelly  Procedure(s) Performed: Procedure(s): CESAREAN SECTION (N/A)  Patient Location: PACU and Women's Unit  Anesthesia Type:Epidural  Level of Consciousness: awake, alert  and oriented  Airway and Oxygen Therapy: Patient Spontanous Breathing  Post-op Pain: mild  Post-op Assessment: Patient's Cardiovascular Status Stable, Respiratory Function Stable, No signs of Nausea or vomiting, Adequate PO intake and Pain level controlled  Post-op Vital Signs: stable  Complications: No apparent anesthesia complications

## 2012-12-16 ENCOUNTER — Encounter (HOSPITAL_COMMUNITY): Payer: Self-pay | Admitting: Obstetrics & Gynecology

## 2012-12-16 LAB — GLUCOSE, CAPILLARY
Glucose-Capillary: 103 mg/dL — ABNORMAL HIGH (ref 70–99)
Glucose-Capillary: 101 mg/dL — ABNORMAL HIGH (ref 70–99)
Glucose-Capillary: 115 mg/dL — ABNORMAL HIGH (ref 70–99)

## 2012-12-16 MED ORDER — TETANUS-DIPHTH-ACELL PERTUSSIS 5-2.5-18.5 LF-MCG/0.5 IM SUSP: 0.5000 mL | Freq: Once | INTRAMUSCULAR | Status: DC

## 2012-12-16 MED ORDER — GLYBURIDE 2.5 MG PO TABS
2.5000 mg | ORAL_TABLET | Freq: Once | ORAL | Status: AC
  Administered 2012-12-16: 2.5 mg via ORAL
  Filled 2012-12-16: qty 1

## 2012-12-16 MED ORDER — GLYBURIDE 2.5 MG PO TABS
2.5000 mg | ORAL_TABLET | Freq: Every day | ORAL | Status: DC
  Administered 2012-12-17 – 2012-12-19 (×3): 2.5 mg via ORAL
  Filled 2012-12-16 (×3): qty 1

## 2012-12-16 MED ORDER — METFORMIN HCL 500 MG PO TABS
1000.0000 mg | ORAL_TABLET | Freq: Two times a day (BID) | ORAL | Status: DC
  Administered 2012-12-16 – 2012-12-17 (×2): 1000 mg via ORAL
  Filled 2012-12-16 (×2): qty 2

## 2012-12-16 NOTE — Progress Notes (Signed)
Pt's 2hr PP cbg 197 at 1130.  I called  Dr Juliene Pina.   She placed new orders. Pt to start Glyubride 2.5 mg every am, with 1 dose given today c lunch.  She also changed Metformin from 500mg  to 1000mg  Bid, starting c dinner meal tonight. Jerene Canny RN

## 2012-12-16 NOTE — Progress Notes (Signed)
Subjective: Postpartum Day 1 Primary Cesarean Delivery Patient reports nipple pain, needs to see Lactation consultant for proper sizing. Ambulating, tolerating gen diet, pain well controlled.    Objective: Vital signs in last 24 hours: Temp:  [97.3 F (36.3 C)-98.2 F (36.8 C)] 98.1 F (36.7 C) (05/02 1000) Pulse Rate:  [74-111] 111 (05/02 1000) Resp:  [14-17] 16 (05/02 1000) BP: (110-126)/(64-82) 126/76 mmHg (05/02 1000) SpO2:  [97 %-100 %] 99 % (05/02 1000) BS are still elevated, currently on Metformin 500mg  bid.   Physical Exam:  General: alert, cooperative and no distress Lochia: appropriate Uterine Fundus: firm, non tender Incision: healing well, covered with surgical dressing DVT Evaluation: No evidence of DVT seen on physical exam.   Recent Labs  12/14/12 0510 12/15/12 0600  HGB 12.8 10.9*  HCT 37.4 32.4*  BS values reviewed.   Assessment/Plan: Status post Cesarean section. Postoperative course complicated by poorly controlled BS, will increase metformin to 1000mg  bid and restart Glyburide 2.5mg  with breakfast and add with dinner if needed.   Post op care reviewed, needs breast pumping assistance. Female infant, in NICU with hypoglycemia, but stable.  Rh positive, Rub immune, TDaP in 3rd trimester  Veronica Kelly 12/16/2012, 12:36 PM

## 2012-12-17 ENCOUNTER — Encounter (HOSPITAL_COMMUNITY): Payer: Self-pay | Admitting: *Deleted

## 2012-12-17 MED ORDER — METFORMIN HCL 500 MG PO TABS
1000.0000 mg | ORAL_TABLET | Freq: Two times a day (BID) | ORAL | Status: DC
  Administered 2012-12-17 – 2012-12-19 (×4): 1000 mg via ORAL
  Filled 2012-12-17 (×4): qty 2

## 2012-12-17 NOTE — Progress Notes (Signed)
POSTOPERATIVE DAY # 2 S/P CS  S:         Reports feeling ok             Tolerating po intake / no nausea / no vomiting / + flatus / no BM             Bleeding is light             Pain controlled with motrin and percocet             Up ad lib / ambulatory/ voiding QS  Newborn breast- feeding (pumping with newborn in NICU)   O:  VS: BP 113/77  Pulse 86  Temp(Src) 97.4 F (36.3 C) (Oral)  Resp 17  Ht 5\' 6"  (1.676 m)  Wt 108.523 kg (239 lb 4 oz)  BMI 38.63 kg/m2  SpO2 99%  LMP 02/20/2012   LABS:  Recent Labs  12/15/12 0600  WBC 15.0*  HGB 10.9*  PLT 230   Postprandial 101 / 115  FBS today 95                     Physical Exam:             Alert and Oriented X3  Lungs: Clear and unlabored  Heart: regular rate and rhythm / no mumurs  Abdomen: soft, non-tender, non-distended active BS             Fundus: firm, non-tender, Ueven             Dressing intact honeycomb              Incision:  approximated with staples / no erythema / no ecchymosis / no drainage  Perineum: no edema  Lochia: light  Extremities: 1+ pedal edema, no calf pain or tenderness, neg Homans  A:        POD # 2 S/P CS            GDMA2 - delivered with preexisting PCOS and glucose intolerance outside of pregnancy  P:        Routine postoperative care                Marlinda Mike CNM, MSN 12/17/2012, 9:50 AM

## 2012-12-18 LAB — HEMOGLOBIN A1C
Hgb A1c MFr Bld: 5.8 % — ABNORMAL HIGH (ref <5.7)
Mean Plasma Glucose: 120 mg/dL — ABNORMAL HIGH (ref <117)

## 2012-12-18 LAB — GLUCOSE, CAPILLARY
Glucose-Capillary: 186 mg/dL — ABNORMAL HIGH (ref 70–99)
Glucose-Capillary: 56 mg/dL — ABNORMAL LOW (ref 70–99)

## 2012-12-18 MED ORDER — HYDROCHLOROTHIAZIDE 12.5 MG PO CAPS
12.5000 mg | ORAL_CAPSULE | Freq: Every day | ORAL | Status: DC
  Administered 2012-12-18 – 2012-12-19 (×2): 12.5 mg via ORAL
  Filled 2012-12-18 (×2): qty 1

## 2012-12-18 NOTE — Progress Notes (Addendum)
POSTOPERATIVE DAY # 3 S/P CS   S:         Reports feeling more sore today             Tolerating po intake / no nausea / no vomiting / + flatus / no BM             Bleeding is light             Pain controlled with motrin and percocet             Up ad lib / ambulatory/ voiding QS  Newborn in NICU - pumping for breast feeding    O:  VS: BP 122/59  Pulse 89  Temp(Src) 98.1 F (36.7 C) (Oral)  Resp 18  Ht 5\' 6"  (1.676 m)  Wt 108.523 kg (239 lb 4 oz)  BMI 38.63 kg/m2  SpO2 99%  LMP 02/20/2012  Breastfeeding? Unknown   FBS - 90 / now 58 prior to lunch - no mid-moring snack & no lunch ordered yet (tx PB and crackers with milk)              No postprandial yesterday - NOT completed by nursing staff             Patient not eating consistently - unsure of what she should be eating and how often             2 hour post breakfast 186 - no call to provider / patient states no sugars just oatmeal for breakfast             Pharmacy changed metformin dose back to 5pm despite provider ordering every 12 hours for 8am & 8pm                                     Physical Exam:             Alert and Oriented X3  Lungs: Clear and unlabored  Heart: regular rate and rhythm / no mumurs  Abdomen: soft, non-tender, non-distended / active BS / panus with edema -no errythema             Fundus: firm, non-tender, Ueven             Dressing OFF  - nursing staff removed last PM (?reason - no note)              Incision:  approximated with staples / no erythema / no ecchymosis / no drainage                              moderate edema superior to incision site  Perineum: no edema  Lochia: light  Extremities: 1+ edema, no calf pain or tenderness, neg Homans  A:        POD # 3 S/P CS            GDMA2 with preexisting glucose intolerance with PCOS  P:         Routine postoperative care               Check A1C              Unstable BS - without any BS to assess from yesterday              Check AC lunch -  consider insulin  coverage to re-establish glycemic control if over 120               Unlikely discharge without glucose stability              Update MD with rounding findings               Reapply honeycomb dressing              Remove staples POD 5-7              HCTZ for dependent edema - decrease edema in panus to reduce risk for seroma   Marlinda Mike CNM, MSN 12/18/2012, 11:54 AM

## 2012-12-18 NOTE — Progress Notes (Signed)
  Nutrition Dx: Food and nutrition-related knowledge deficit r/t no previous education aeb newly diagnosed DM.   Nutrition education consult for Carbohydrate Modified  Diabetic Diet completed.  "Carbohydrate counting for vegetarians with Diabaetes" handout given to patient. Husband present.   Stressed carbohydrate counting ( which pt was not doing during pregnancy ), 60-75 grams per meal, 30 grams per snack. Stressed measurement of carbohydrate containing foods/ reading labels.Advised vegetarian type protein at all  meals.   Basic concepts reviewed.  Questions answered.  Patient verbalizes understanding.  Will benefit from outpatient education at [facility]   [facility].Odis Luster LDN Neonatal Nutrition Support Specialist Pager [phone]

## 2012-12-18 NOTE — Plan of Care (Signed)
Problem: Discharge Progression Outcomes Goal: Activity appropriate for discharge plan Outcome: Completed/Met Date Met:  12/18/12 Ambulates in room and halls without difficulty Goal: Complications resolved/controlled Outcome: Completed/Met Date Met:  12/18/12 Has had a BM and passed flatus Goal: Pain controlled with appropriate interventions Outcome: Completed/Met Date Met:  12/18/12 Good pain control on po Motrin and Percocet Goal: Discharge plan in place and appropriate Outcome: Completed/Met Date Met:  12/18/12 VSS Pain controlled No signs of infection Understands self care Understands when to call the MD

## 2012-12-19 LAB — GLUCOSE, CAPILLARY: Glucose-Capillary: 85 mg/dL (ref 70–99)

## 2012-12-19 MED ORDER — IBUPROFEN 600 MG PO TABS: 600.0000 mg | ORAL_TABLET | Freq: Four times a day (QID) | ORAL | Status: AC

## 2012-12-19 MED ORDER — HYDROCHLOROTHIAZIDE 12.5 MG PO CAPS: 12.5000 mg | ORAL_CAPSULE | Freq: Every day | ORAL | Status: DC

## 2012-12-19 MED ORDER — OXYCODONE-ACETAMINOPHEN 5-325 MG PO TABS: 1.0000 | ORAL_TABLET | ORAL | Status: DC | PRN

## 2012-12-19 NOTE — Plan of Care (Signed)
Problem: Discharge Progression Outcomes Goal: Barriers To Progression Addressed/Resolved Outcome: Completed/Met Date Met:  12/19/12 Patient has good pain control  Patient has had a BM

## 2012-12-19 NOTE — Progress Notes (Signed)
Pt discharged to home with husband.  Condition stable.  Pt ambulated to NICU with her husband with plans to leave hospital from there.  No equipment for home ordered at discharge.

## 2012-12-19 NOTE — Progress Notes (Signed)
POSTOPERATIVE DAY # 4 S/P CS    S:         Reports feeling better today             Tolerating po intake / no  nausea / no vomiting / + flatus / + BM             Bleeding is light             Pain controlled with motrin and percocet             Up ad lib / ambulatory/ voiding QS  Newborn in NICU - pumping for planned breast- feeding   O:  VS: BP 136/65  Pulse 85  Temp(Src) 98.1 F (36.7 C) (Oral)  Resp 19  Ht 5\' 6"  (1.676 m)  Wt 108.523 kg (239 lb 4 oz)  BMI 38.63 kg/m2  SpO2 98%  LMP 02/20/2012  Breastfeeding? Unknown  FBS 85 Postprandial :134-69-104  HgbA1C 5.8             Physical Exam:             Alert and Oriented X3  Lungs: Clear and unlabored  Heart: regular rate and rhythm / no mumurs  Abdomen: soft, non-tender, non-distended/ decreased edema in panus - moderate firmness today / no erythema             Fundus: firm, non-tender, U-1             Dressing intact honeycomb              Incision:  approximated with staples / no erythema / no ecchymosis / no drainage  Perineum: no edema  Lochia: light  Extremities: 1+ decreasing edema, no calf pain or tenderness, negative Homans  A:        POD # 4 S/P CS            GDMA2 / pre-existing glucose intolerance -PCOS  P:        Routine postoperative care              DC home today             OV Wednesday - likely will stop AM glyberide at that time if BS stable             HCTZ for 5 days     Marlinda Mike CNM, MSN 12/19/2012, 9:08 AM

## 2012-12-19 NOTE — Plan of Care (Signed)
Problem: Discharge Progression Outcomes Goal: Remove staples per MD order Outcome: Not Applicable Date Met:  12/19/12 To be removed in office 12-21-12

## 2012-12-19 NOTE — Discharge Summary (Signed)
POSTOPERATIVE DISCHARGE SUMMARY:  Patient ID: Veronica Kelly MRN: [id number] DOB/AGE: [birth year] 35 y.o.  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 Hypothyroidism   . Diabetes mellitus without complication   . PCOS (polycystic ovarian syndrome)   . Depression   . Tuberculosis   . GDM, class A2 12/15/2012  . Unspecified hypothyroidism 12/15/2012  . Postpartum care following cesarean delivery (12/15/12) 12/15/2012    Past surgical history:  Past Surgical History  Procedure Laterality Date  . Cesarean section N/A 12/15/2012    Procedure: CESAREAN SECTION;  Surgeon: Robley Fries, MD;  Location: [facility];  Service: Obstetrics;  Laterality: N/A;    Family History: No family history on file.  Social History:  reports that she has never smoked. She does not have any smokeless tobacco history on file. She reports that she does not drink alcohol or use illicit drugs.  Allergies: Latex   Current Medications at time of admission:  Metformin 1000BID Glyberide 2.5 BID Prenatal daily Levothyroxine daily  Intrapartum Course: normal labor progression to complete  dilation after spontaneous labor                                      arrest of descent at 0 station / persistent OP  Procedures: Cesarean section delivery of female newborn by Dr Juliene Pina  See operative report for further details APGAR (1 MIN): 7   APGAR (5 MINS): 8    Weight 6 pounds - 11 ounces  Postoperative / postpartum course:  unstable glycemic control related to poor dietary choices and delayed meals - resolved POD 4   newborn admission to NICU for glycemic instability  lactational difficulties - pumping for BF  Physical Exam:   VSS: Temp:  [97.6 F (36.4 C)-98.4 F (36.9 C)] 98.1 F (36.7 C) (05/05 0546) Pulse Rate:  [85-91] 85 (05/05 0546) Resp:  [19-20] 19 (05/05 0546) BP: (120-136)/(65-71) 136/65 mmHg (05/05 0546) SpO2:  [98 %-100 %] 98 % (05/05 0546)  LABS: No results found for this basename: WBC, HGB, PLT,  in the last 72 hours  General: pleasant / NAD / ambulatory Heart:RR Lungs:clear Abdomen: soft and non-distended / active BS with BM prior to DC / panus with dependent edema  - no erythema (monitor for developing seroma) Extremities: 2+ edema lower extremities  Dressing: intact honeycomb - no erythema Incision:  approximated with staple / no erythema / no ecchymosis / no drainage  Discharge Instructions:  Discharged Condition: stable Activity: pelvic rest and postoperative restrictions x 2 weeks Diet: Low sugar and low carb / regular meals with protein snacks at  scheduled time intervals Medications: see below   Medication List    TAKE these medications       glyBURIDE 2.5 MG tablet  Commonly known as:  DIABETA  Take 2.5 mg by mouth daily with breakfast.     hydrochlorothiazide 12.5 MG capsule  Commonly known as:  MICROZIDE  Take 1 capsule (12.5 mg total) by mouth daily.     ibuprofen 600 MG tablet  Commonly known as:  ADVIL,MOTRIN  Take 1 tablet (600 mg total) by mouth every 6 (six) hours.     levothyroxine 100 MCG tablet  Commonly known as:   SYNTHROID, LEVOTHROID  Take 0.01 mcg by mouth daily. On Tuesdays and Thursdays patient takes 2, all other days she takes 1 Synthroid     metFORMIN 1000 MG tablet  Commonly known as:  GLUCOPHAGE  Take 1,000 mg by mouth 2 (two) times daily with a meal.     oxyCODONE-acetaminophen 5-325 MG per tablet  Commonly known as:  PERCOCET/ROXICET  Take 1-2 tablets by mouth every 4 (four) hours as needed.     prenatal multivitamin Tabs  Take 1 tablet by mouth daily at 12 noon.       Wound Care: return to [facility] in 2 days for removal of dressing and staples Postpartum Instructions: [facility] discharge booklet - instructions reviewed Discharge to: Home  Follow up : [facility] in 6 weeks for routine postpartum visit                      Signed: Marlinda Mike CNM, MSN 12/19/2012, 9:14 AM

## 2012-12-26 ENCOUNTER — Ambulatory Visit (HOSPITAL_COMMUNITY): Payer: 59

## 2012-12-27 NOTE — Discharge Summary (Signed)
Reviewed and agree with note and plan. V.Jaylenn Baiza, MD  

## 2012-12-28 ENCOUNTER — Inpatient Hospital Stay (HOSPITAL_COMMUNITY): Admit: 2012-12-28 | Payer: 59

## 2012-12-30 ENCOUNTER — Ambulatory Visit (HOSPITAL_COMMUNITY)
Admission: RE | Admit: 2012-12-30 | Discharge: 2012-12-30 | Disposition: A | Discharge status: Home / Self Care | Payer: 59 | Source: Ambulatory Visit | Attending: Obstetrics and Gynecology | Admitting: Obstetrics and Gynecology

## 2012-12-30 NOTE — Lactation Note (Signed)
Adult Lactation Consultation Outpatient Visit Note  Patient Name: Veronica Kelly  Baby name: [name] Date of Birth: [birth year]  DOB: [birth year] Gestational Age at Delivery: 37 BW: 6# 11 oz (3040g) Type of Delivery: C/S   Today's weight: 7# 1.8 oz  Breastfeeding History: Frequency of Breastfeeding: during the day     Length of Feeding: Voids:  Stools: once q24 hours (blackish-dark greenish?). However, Mom says that 2 kg of stool are passed each time.   Supplementing / Method: Pumping:  Type of Pump: Medela & hand expression   Frequency: q3-4 for 30 min  Volume:  5-10 mL (max)/session (L side gives more than R)  Comments: Baby receives formula, 50-75mL q3-4   Consultation Evaluation:  Initial Feeding Assessment: Pre-feed [id number] Post-feed Weight:? Scale reads a weight loss of 2g Amount Transferred: Comments: L breast, w/nipple shield (size 20)  Additional Feeding Assessment: Pre-feed [id number] Post-feed Weight: 3234g Amount Transferred:54mL Comments: R breast, using nipple shield & SNS  Total Breast milk Transferred this Visit: 0?. However, milk seen in nipple shield Total Supplement Given: 58mL   Follow-Up Baby has gained 3.3 oz in the last 3 days.  Baby is being primarily bottle-fed w/formula. Mom does put baby to breast for about 10 min prior to bottle feeding.  Mom's milk has not come in, yet.   Baby was put to breast to assess milk transfer.  Little was transferred at the 1st breast; an SNS was added to the 2nd breast b/c baby became fussy. Baby only transferred 10mL, which was mostly due to the formula.  Baby became fussy & it was decided to finish the feed w/the bottle.    Parents were shown how to do "paced bottle-feeding." To investigate cause of milk supply, Moms' thyroid levels should be rechecked (they have not been checked since pregnancy) to see if her synthroid dosage needs to be altered. Mom is currently on Metformin.     Mom will also try  [employer]'s "More Milk Plus" and will try pumping or hand-expressing 8x/day for about 20 min.  Parents to call peds office b/c baby's poop had been yellow, but it has been blackish or dark green for the last 4 days. Parents will be sure to check color of stool w/the light on.  Parents had been giving the baby "Somva 53", which parents say is like gripe water, but w/the addition of 34 herbs. Baby has 2 small dark spots on tongue, which parents attribute to the Somva 34.  Parents stopped giving the Somva 2 days ago. Perhaps the Somva is the reason for the darkening of the stool (yet good weight gain)?  Mom says she will continue to try putting the baby to the breast, but parents understand that baby may not show much improvement.        Lurline Hare [facility] 12/30/2012, 4:02 PM

## 2013-07-07 ENCOUNTER — Inpatient Hospital Stay (HOSPITAL_COMMUNITY)
Admission: AD | Admit: 2013-07-07 | Discharge: 2013-07-07 | Disposition: A | Discharge status: Home / Self Care | Payer: 59 | Source: Ambulatory Visit | Attending: Obstetrics and Gynecology | Admitting: Obstetrics and Gynecology

## 2013-07-07 ENCOUNTER — Encounter (HOSPITAL_COMMUNITY): Payer: Self-pay | Admitting: *Deleted

## 2013-07-07 ENCOUNTER — Encounter (HOSPITAL_COMMUNITY): Payer: Self-pay | Admitting: Emergency Medicine

## 2013-07-07 ENCOUNTER — Emergency Department (INDEPENDENT_AMBULATORY_CARE_PROVIDER_SITE_OTHER)
Admission: EM | Admit: 2013-07-07 | Discharge: 2013-07-07 | Disposition: A | Discharge status: Home / Self Care | Payer: 59 | Source: Home / Self Care | Attending: Family Medicine | Admitting: Family Medicine

## 2013-07-07 DIAGNOSIS — N949 Unspecified condition associated with female genital organs and menstrual cycle: Secondary | ICD-10-CM

## 2013-07-07 DIAGNOSIS — N938 Other specified abnormal uterine and vaginal bleeding: Secondary | ICD-10-CM

## 2013-07-07 LAB — CBC
HCT: 35.5 % — ABNORMAL LOW (ref 36.0–46.0)
Hemoglobin: 11.8 g/dL — ABNORMAL LOW (ref 12.0–15.0)
MCH: 28.6 pg (ref 26.0–34.0)
RBC: 4.12 MIL/uL (ref 3.87–5.11)

## 2013-07-07 NOTE — ED Notes (Signed)
Report given to charge RN in [facility].

## 2013-07-07 NOTE — ED Notes (Signed)
Dr Mikel Cella said she called [facility] and gave report to a nurse but does not know the name.

## 2013-07-07 NOTE — [facility]U Note (Addendum)
Vaginal bleeding for 25days. Had u/s last wk and had 12mm thickness of uterus. Given provera to stop the bleeding. Did not stop bleeding. Bleeding more and passing clots that are size of eggs. Changing a pad every 2 hours which are saturated. Having heart palpitations and weak. Had EKG at Urgent Care tonight and it was normal. Urgent Care sent me here

## 2013-07-07 NOTE — ED Notes (Signed)
C/o abnormal vaginal bleeding for the past 25 days.  States saw obstetrician last wk and given provera.  Still having bleeding seems to be heavier.  Pt was also started on ortho tricyclen and states having palpitations.

## 2013-07-07 NOTE — ED Provider Notes (Signed)
CSN: [id number]     Arrival date & time 07/07/13  1804 History   First MD Initiated Contact with Patient 07/07/13 1955     Chief Complaint  Patient presents with  . Vaginal Bleeding   (Consider location/radiation/quality/duration/timing/severity/associated sxs/prior Treatment) HPI Patient is a 37 yo F presenting with vaginal bleeding x25 days. She was seen by Dr. Mitzi Hansen at [facility] one week ago for this. Had abd u/s at that time which showed a thickened endometrial lining. Was started on Provera x5 and bleeding did not stop. Called yesterday and started on OCP, still not slowing down. Soaks one pad every 1-2 hours. Now passing egg sized clots for last 5 days. Denies abdominal pain, headaches. She does report intermittent heart palpitations. Increased weakness recently. She had her labs checked at work at her [employer] was 13.  Past Medical History  Diagnosis Date  . Hypothyroidism   . Diabetes mellitus without complication   . PCOS (polycystic ovarian syndrome)   . Depression   . Tuberculosis   . GDM, class A2 12/15/2012  . Unspecified hypothyroidism 12/15/2012  . Postpartum care following cesarean delivery (12/15/12) 12/15/2012   Past Surgical History  Procedure Laterality Date  . Cesarean section N/A 12/15/2012    Procedure: CESAREAN SECTION;  Surgeon: Robley Fries, MD;  Location: [facility];  Service: Obstetrics;  Laterality: N/A;   History reviewed. No pertinent family history. History  Substance Use Topics  . Smoking status: Never Smoker   . Smokeless tobacco: Not on file  . Alcohol Use: No   OB History   Grav Para Term Preterm Abortions TAB SAB Ect Mult Living   1 1 1       1      Review of Systems  Constitutional: Negative for fever and chills.  HENT: Negative for congestion.   Eyes: Negative for visual disturbance.  Respiratory: Negative for cough and shortness of breath.   Cardiovascular: Negative for chest pain and leg swelling.  Gastrointestinal: Negative for abdominal  pain.  Genitourinary: Positive for menstrual problem. Negative for dysuria.  Musculoskeletal: Negative for arthralgias and myalgias.  Skin: Negative for rash.  Neurological: Negative for headaches.    Allergies  Latex  Home Medications   Current Outpatient Rx  Name  Route  Sig  Dispense  Refill  . glyBURIDE (DIABETA) 2.5 MG tablet   Oral   Take 2.5 mg by mouth daily with breakfast.         . hydrochlorothiazide (MICROZIDE) 12.5 MG capsule   Oral   Take 1 capsule (12.5 mg total) by mouth daily.   5 capsule   0   . levothyroxine (SYNTHROID, LEVOTHROID) 100 MCG tablet   Oral   Take 0.01 mcg by mouth daily. On Tuesdays and Thursdays patient takes 2, all other days she takes 1 Synthroid         . metFORMIN (GLUCOPHAGE) 1000 MG tablet   Oral   Take 1,000 mg by mouth 2 (two) times daily with a meal.         . ibuprofen (ADVIL,MOTRIN) 600 MG tablet   Oral   Take 1 tablet (600 mg total) by mouth every 6 (six) hours.   30 tablet   0   . oxyCODONE-acetaminophen (PERCOCET/ROXICET) 5-325 MG per tablet   Oral   Take 1-2 tablets by mouth every 4 (four) hours as needed.   30 tablet   0   . Prenatal Vit-Fe Fumarate-FA (PRENATAL MULTIVITAMIN) TABS   Oral   Take  1 tablet by mouth daily at 12 noon.          Pulse 83  Temp(Src) 97 F (36.1 C) (Oral)  Resp 18  SpO2 99%  Breastfeeding? Unknown Physical Exam  Constitutional: She is oriented to person, place, and time. She appears well-developed and well-nourished. No distress.  HENT:  Head: Normocephalic and atraumatic.  Neck: Neck supple.  Cardiovascular: Normal rate, regular rhythm and normal heart sounds.   No murmur heard. Pulmonary/Chest: Effort normal and breath sounds normal. She has no wheezes.  Abdominal: Soft. She exhibits no distension. There is no tenderness.  Musculoskeletal: Normal range of motion. She exhibits no edema and no tenderness.  Neurological: She is alert and oriented to person, place, and  time.  Skin: Skin is warm and dry. No rash noted.  Psychiatric: She has a normal mood and affect.    ED Course  Procedures (including critical care time) Labs Review Labs Reviewed - No data to display Imaging Review No results found.  EKG: normal EKG, normal sinus rhythm, unchanged from previous tracings.   MDM   1. DUB (dysfunctional uterine bleeding)    37 yo with DUB despite appropriate hormonal treatment. I am unable to view her ultrasound reports in our EMR.  She is concerned about her bleeding, especially since it is increasing and now with clots. EKG wnl (obtained for reported palpitations), and appears well hydrated. Will discharge from here, and patient will take private vehicle to [facility] for further work up and evaluation by gyn.  Patient agrees with this plan, and I have spoken with charge nurse at [facility].   Hilarie Fredrickson, MD 07/07/13 2052

## 2013-07-07 NOTE — ED Provider Notes (Signed)
CSN: [id number]     Arrival date & time 07/07/13  2119 History   First MD Initiated Contact with Patient 07/07/13 1955     Chief Complaint  Patient presents with  . Vaginal Bleeding   (Consider location/radiation/quality/duration/timing/severity/associated sxs/prior Treatment) Vaginal Bleeding Pertinent negatives include no abdominal pain, chills, dysuria, fever, headaches or rash.   Patient is a 37 yo F presenting with vaginal bleeding x25 days. She was seen by Dr. Juliene Pina at [facility] one week ago for this. Had abd u/s at that time which showed a thickened endometrial lining. Was started on Provera x5 and bleeding did not stop. Called yesterday and started on OCP, still not slowing down. Soaks one pad every 1-2 hours. Now passing egg sized clots for last 5 days. Denies abdominal pain, headaches. She does report intermittent heart palpitations. Increased weakness recently. She had her labs checked at work at her HgB was 13. Seen in ER now here per recommendation  Past Medical History  Diagnosis Date  . Hypothyroidism   . Diabetes mellitus without complication   . PCOS (polycystic ovarian syndrome)   . Depression   . Tuberculosis   . GDM, class A2 12/15/2012  . Unspecified hypothyroidism 12/15/2012  . Postpartum care following cesarean delivery (12/15/12) 12/15/2012   Past Surgical History  Procedure Laterality Date  . Cesarean section N/A 12/15/2012    Procedure: CESAREAN SECTION;  Surgeon: Robley Fries, MD;  Location: [facility];  Service: Obstetrics;  Laterality: N/A;   No family history on file. History  Substance Use Topics  . Smoking status: Never Smoker   . Smokeless tobacco: Not on file  . Alcohol Use: No   OB History   Grav Para Term Preterm Abortions TAB SAB Ect Mult Living   1 1 1       1      Review of Systems  Constitutional: Negative for fever and chills.  HENT: Negative for congestion.   Eyes: Negative for visual disturbance.  Respiratory: Negative for cough and  shortness of breath.   Cardiovascular: Negative for chest pain and leg swelling.  Gastrointestinal: Negative for abdominal pain.  Genitourinary: Positive for vaginal bleeding and menstrual problem. Negative for dysuria.  Musculoskeletal: Negative for arthralgias and myalgias.  Skin: Negative for rash.  Neurological: Negative for headaches.    Allergies  Latex  Home Medications   No current outpatient prescriptions on file. BP 108/73  Pulse 107  Temp(Src) 97.7 F (36.5 C)  Resp 18  Ht 5\' 4"  (1.626 m)  Wt 96.072 kg (211 lb 12.8 oz)  BMI 36.34 kg/m2  SpO2 100% Physical Exam  Constitutional: She is oriented to person, place, and time. She appears well-developed and well-nourished. No distress.  HENT:  Head: Normocephalic and atraumatic.  Neck: Neck supple.  Cardiovascular: Normal rate, regular rhythm and normal heart sounds.   No murmur heard. Pulmonary/Chest: Effort normal and breath sounds normal. She has no wheezes.  Abdominal: Soft. She exhibits no distension. There is no tenderness.  Musculoskeletal: Normal range of motion. She exhibits no edema and no tenderness.  Neurological: She is alert and oriented to person, place, and time.  Skin: Skin is warm and dry. No rash noted.  Psychiatric: She has a normal mood and affect.    ED Course  Procedures (including critical care time) Labs Review Labs Reviewed  CBC - Abnormal; Notable for the following:    WBC 13.1 (*)    Hemoglobin 11.8 (*)    HCT 35.5 (*)  All other components within normal limits   Imaging Review No results found.  EKG: normal EKG, normal sinus rhythm, unchanged from previous tracings.   MDM    37 yo with DUB despite appropriate hormonal treatment. I am unable to view her ultrasound reports in our EMR.  She is concerned about her bleeding, especially since it is increasing and now with clots. EKG wnl (obtained for reported palpitations), and appears well hydrated. Hgb stable. Continue OCP  bid and DC home Fu office as scheduled

## 2013-07-09 LAB — CBC
Hemoglobin: 11 g/dL — ABNORMAL LOW (ref 12.0–15.0)
MCV: 85.2 fL (ref 78.0–100.0)
Platelets: 261 10*3/uL (ref 150–400)
RBC: 3.79 MIL/uL — ABNORMAL LOW (ref 3.87–5.11)
WBC: 9.2 10*3/uL (ref 4.0–10.5)

## 2013-07-09 LAB — HCG, SERUM, QUALITATIVE: Preg, Serum: NEGATIVE

## 2013-07-19 NOTE — ED Provider Notes (Signed)
Medical screening examination/treatment/procedure(s) were performed by resident physician or non-physician practitioner and as supervising physician I was immediately available for consultation/collaboration.   Karrington Mccravy DOUGLAS MD.   Muscab Brenneman D Tiyona Desouza, MD 07/19/13 1856 

## 2013-07-27 ENCOUNTER — Other Ambulatory Visit: Payer: Self-pay | Admitting: Obstetrics & Gynecology

## 2013-07-27 ENCOUNTER — Encounter (HOSPITAL_BASED_OUTPATIENT_CLINIC_OR_DEPARTMENT_OTHER): Payer: Self-pay | Admitting: *Deleted

## 2013-07-27 NOTE — Progress Notes (Signed)
NPO AFTER MN. ARRIVE AT 1015. NEEDS CBC, BMET, URINE PREG.  WILL TAKE SYNTHROID AND BCP AM DOS W/ SIPS OF WATER.

## 2013-07-28 ENCOUNTER — Encounter (HOSPITAL_BASED_OUTPATIENT_CLINIC_OR_DEPARTMENT_OTHER)
Admission: RE | Disposition: A | Discharge status: Home / Self Care | Payer: Self-pay | Source: Ambulatory Visit | Attending: Obstetrics & Gynecology

## 2013-07-28 ENCOUNTER — Ambulatory Visit (HOSPITAL_BASED_OUTPATIENT_CLINIC_OR_DEPARTMENT_OTHER)
Admission: RE | Admit: 2013-07-28 | Discharge: 2013-07-28 | Disposition: A | Discharge status: Home / Self Care | Payer: 59 | Source: Ambulatory Visit | Attending: Obstetrics & Gynecology | Admitting: Obstetrics & Gynecology

## 2013-07-28 ENCOUNTER — Ambulatory Visit (HOSPITAL_BASED_OUTPATIENT_CLINIC_OR_DEPARTMENT_OTHER): Payer: 59 | Admitting: Anesthesiology

## 2013-07-28 ENCOUNTER — Encounter (HOSPITAL_BASED_OUTPATIENT_CLINIC_OR_DEPARTMENT_OTHER): Payer: Self-pay

## 2013-07-28 ENCOUNTER — Encounter (HOSPITAL_BASED_OUTPATIENT_CLINIC_OR_DEPARTMENT_OTHER): Payer: 59 | Admitting: Anesthesiology

## 2013-07-28 DIAGNOSIS — R42 Dizziness and giddiness: Secondary | ICD-10-CM | POA: Insufficient documentation

## 2013-07-28 DIAGNOSIS — Z98891 History of uterine scar from previous surgery: Secondary | ICD-10-CM

## 2013-07-28 DIAGNOSIS — Z79899 Other long term (current) drug therapy: Secondary | ICD-10-CM | POA: Insufficient documentation

## 2013-07-28 DIAGNOSIS — E282 Polycystic ovarian syndrome: Secondary | ICD-10-CM | POA: Insufficient documentation

## 2013-07-28 DIAGNOSIS — N920 Excessive and frequent menstruation with regular cycle: Secondary | ICD-10-CM | POA: Insufficient documentation

## 2013-07-28 DIAGNOSIS — N921 Excessive and frequent menstruation with irregular cycle: Secondary | ICD-10-CM

## 2013-07-28 DIAGNOSIS — E119 Type 2 diabetes mellitus without complications: Secondary | ICD-10-CM | POA: Insufficient documentation

## 2013-07-28 DIAGNOSIS — N970 Female infertility associated with anovulation: Secondary | ICD-10-CM | POA: Insufficient documentation

## 2013-07-28 DIAGNOSIS — E669 Obesity, unspecified: Secondary | ICD-10-CM

## 2013-07-28 DIAGNOSIS — E039 Hypothyroidism, unspecified: Secondary | ICD-10-CM | POA: Insufficient documentation

## 2013-07-28 HISTORY — DX: Anemia, unspecified: D64.9

## 2013-07-28 HISTORY — DX: Palpitations: R00.2

## 2013-07-28 HISTORY — DX: Personal history of other medical treatment: Z92.89

## 2013-07-28 HISTORY — DX: Excessive and frequent menstruation with regular cycle: N92.0

## 2013-07-28 HISTORY — DX: Type 2 diabetes mellitus without complications: E11.9

## 2013-07-28 HISTORY — PX: HYSTEROSCOPY WITH D & C: SHX1775

## 2013-07-28 LAB — BASIC METABOLIC PANEL
CO2: 21 mEq/L (ref 19–32)
Calcium: 9.2 mg/dL (ref 8.4–10.5)
Chloride: 103 mEq/L (ref 96–112)
Creatinine, Ser: 0.7 mg/dL (ref 0.50–1.10)
Glucose, Bld: 87 mg/dL (ref 70–99)
Potassium: 4 mEq/L (ref 3.5–5.1)
Sodium: 135 mEq/L (ref 135–145)

## 2013-07-28 LAB — POCT PREGNANCY, URINE: Preg Test, Ur: NEGATIVE

## 2013-07-28 LAB — CBC
HCT: 35.2 % — ABNORMAL LOW (ref 36.0–46.0)
MCH: 28.2 pg (ref 26.0–34.0)
MCHC: 33.5 g/dL (ref 30.0–36.0)
Platelets: 322 10*3/uL (ref 150–400)
RDW: 13.2 % (ref 11.5–15.5)

## 2013-07-28 SURGERY — DILATATION AND CURETTAGE /HYSTEROSCOPY
Anesthesia: Monitor Anesthesia Care

## 2013-07-28 MED ORDER — DOXYCYCLINE HYCLATE 50 MG PO CAPS: 100.0000 mg | ORAL_CAPSULE | Freq: Two times a day (BID) | ORAL | Status: AC

## 2013-07-28 MED ORDER — LIDOCAINE HCL (CARDIAC) 20 MG/ML IV SOLN
INTRAVENOUS | Status: DC | PRN
  Administered 2013-07-28: 50 mg via INTRAVENOUS

## 2013-07-28 MED ORDER — GLYCINE 1.5 % IR SOLN
Status: DC | PRN
  Administered 2013-07-28: 3000 mL

## 2013-07-28 MED ORDER — KETOROLAC TROMETHAMINE 30 MG/ML IJ SOLN
INTRAMUSCULAR | Status: DC | PRN
  Administered 2013-07-28: 30 mg via INTRAVENOUS

## 2013-07-28 MED ORDER — MIDAZOLAM HCL 5 MG/5ML IJ SOLN
INTRAMUSCULAR | Status: DC | PRN
  Administered 2013-07-28: 2 mg via INTRAVENOUS

## 2013-07-28 MED ORDER — ONDANSETRON HCL 4 MG/2ML IJ SOLN
INTRAMUSCULAR | Status: DC | PRN
  Administered 2013-07-28: 4 mg via INTRAVENOUS

## 2013-07-28 MED ORDER — PROPOFOL 10 MG/ML IV EMUL
INTRAVENOUS | Status: DC | PRN
  Administered 2013-07-28: 50 ug/kg/min via INTRAVENOUS

## 2013-07-28 MED ORDER — MEPERIDINE HCL 25 MG/ML IJ SOLN
6.2500 mg | INTRAMUSCULAR | Status: DC | PRN
  Filled 2013-07-28: qty 1

## 2013-07-28 MED ORDER — OXYCODONE HCL 5 MG PO TABS
5.0000 mg | ORAL_TABLET | Freq: Once | ORAL | Status: DC | PRN
  Filled 2013-07-28: qty 1

## 2013-07-28 MED ORDER — DOXYCYCLINE HYCLATE 100 MG IV SOLR
100.0000 mg | Freq: Two times a day (BID) | INTRAVENOUS | Status: DC
  Filled 2013-07-28: qty 100

## 2013-07-28 MED ORDER — HYDROMORPHONE HCL PF 1 MG/ML IJ SOLN
0.2500 mg | INTRAMUSCULAR | Status: DC | PRN
  Filled 2013-07-28: qty 1

## 2013-07-28 MED ORDER — PROPOFOL 10 MG/ML IV BOLUS
INTRAVENOUS | Status: DC | PRN
  Administered 2013-07-28: 50 mg via INTRAVENOUS

## 2013-07-28 MED ORDER — PROMETHAZINE HCL 25 MG/ML IJ SOLN
6.2500 mg | INTRAMUSCULAR | Status: DC | PRN
  Filled 2013-07-28: qty 1

## 2013-07-28 MED ORDER — FENTANYL CITRATE 0.05 MG/ML IJ SOLN
INTRAMUSCULAR | Status: DC | PRN
  Administered 2013-07-28: 100 ug via INTRAVENOUS

## 2013-07-28 MED ORDER — LACTATED RINGERS IV SOLN
INTRAVENOUS | Status: DC
  Administered 2013-07-28: 11:00:00 via INTRAVENOUS
  Filled 2013-07-28: qty 1000

## 2013-07-28 MED ORDER — LIDOCAINE HCL 1 % IJ SOLN
INTRAMUSCULAR | Status: DC | PRN
  Administered 2013-07-28: 20 mL

## 2013-07-28 MED ORDER — OXYCODONE-ACETAMINOPHEN 5-325 MG PO TABS: 1.0000 | ORAL_TABLET | Freq: Four times a day (QID) | ORAL | Status: AC | PRN

## 2013-07-28 MED ORDER — DEXAMETHASONE SODIUM PHOSPHATE 4 MG/ML IJ SOLN
INTRAMUSCULAR | Status: DC | PRN
  Administered 2013-07-28: 10 mg via INTRAVENOUS

## 2013-07-28 MED ORDER — MIDAZOLAM HCL 2 MG/2ML IJ SOLN
INTRAMUSCULAR | Status: AC
  Filled 2013-07-28: qty 2

## 2013-07-28 MED ORDER — FENTANYL CITRATE 0.05 MG/ML IJ SOLN
INTRAMUSCULAR | Status: AC
  Filled 2013-07-28: qty 2

## 2013-07-28 MED ORDER — OXYCODONE HCL 5 MG/5ML PO SOLN
5.0000 mg | Freq: Once | ORAL | Status: DC | PRN
  Filled 2013-07-28: qty 5

## 2013-07-28 SURGICAL SUPPLY — 28 items
CANISTER SUCTION 2500CC (MISCELLANEOUS) ×2 IMPLANT
CATH ROBINSON RED A/P 16FR (CATHETERS) IMPLANT
CLOTH BEACON ORANGE TIMEOUT ST (SAFETY) ×2 IMPLANT
CORD ACTIVE DISPOSABLE (ELECTRODE) ×1
CORD ELECTRO ACTIVE DISP (ELECTRODE) ×1 IMPLANT
COVER TABLE BACK 60X90 (DRAPES) ×2 IMPLANT
DRAPE CAMERA CLOSED 9X96 (DRAPES) ×2 IMPLANT
DRAPE LG THREE QUARTER DISP (DRAPES) ×2 IMPLANT
DRESSING TELFA 8X3 (GAUZE/BANDAGES/DRESSINGS) ×2 IMPLANT
ELECT LOOP GYNE PRO 24FR (CUTTING LOOP) ×4
ELECT REM PT RETURN 9FT ADLT (ELECTROSURGICAL) ×2
ELECT VAPORTRODE GRVD BAR (ELECTRODE) IMPLANT
ELECTRODE LOOP GYNE PRO 24FR (CUTTING LOOP) ×2 IMPLANT
ELECTRODE REM PT RTRN 9FT ADLT (ELECTROSURGICAL) ×1 IMPLANT
GLOVE BIO SURGEON STRL SZ 6.5 (GLOVE) ×2 IMPLANT
GLOVE BIO SURGEON STRL SZ7 (GLOVE) ×2 IMPLANT
GLOVE INDICATOR 7.5 STRL GRN (GLOVE) ×4 IMPLANT
GLYCINE 1.5% IRRIG UROMATIC (IV SOLUTION) ×2 IMPLANT
GOWN PREVENTION PLUS LG XLONG (DISPOSABLE) ×2 IMPLANT
NEEDLE SPNL 22GX3.5 QUINCKE BK (NEEDLE) ×2 IMPLANT
PACK BASIN DAY SURGERY FS (CUSTOM PROCEDURE TRAY) ×2 IMPLANT
PAD OB MATERNITY 4.3X12.25 (PERSONAL CARE ITEMS) ×2 IMPLANT
PAD PREP 24X48 CUFFED NSTRL (MISCELLANEOUS) ×2 IMPLANT
SYR CONTROL 10ML LL (SYRINGE) ×2 IMPLANT
TOWEL OR 17X24 6PK STRL BLUE (TOWEL DISPOSABLE) ×4 IMPLANT
TRAY DSU PREP LF (CUSTOM PROCEDURE TRAY) ×2 IMPLANT
TUBING HYDROFLEX HYSTEROSCOPY (TUBING) ×2 IMPLANT
WATER STERILE IRR 500ML POUR (IV SOLUTION) ×2 IMPLANT

## 2013-07-28 NOTE — Anesthesia Postprocedure Evaluation (Signed)
Anesthesia Post Note  Patient: Veronica Kelly  Procedure(s) Performed: Procedure(s) (LRB): DILATATION AND CURETTAGE /HYSTEROSCOPY (N/A)  Anesthesia type: MAC  Patient location: PACU  Post pain: Pain level controlled  Post assessment: Post-op Vital signs reviewed  Last Vitals: BP 103/55  Pulse 75  Temp(Src) 36.6 C (Oral)  Resp 16  Ht 5\' 4"  (1.626 m)  Wt 209 lb (94.802 kg)  BMI 35.86 kg/m2  SpO2 97%  LMP 07/27/2013  Breastfeeding? No  Post vital signs: Reviewed  Level of consciousness: awake  Complications: No apparent anesthesia complications

## 2013-07-28 NOTE — Op Note (Signed)
Preoperative diagnosis: ** Menometrorrhagia, anovulatory bleeding  Postop diagnosis: as above.  Procedure: Hysteroscopy and D&C Anesthesia MAC and paracervical block  Surgeon: Shea Evans, MD  Assistant: none IV fluids : LR 400 cc Estimated blood loss: 15 cc  Urine output: straight catheter preop   Complications none  Condition stable  Disposition PACU  Specimen: Endometrial curettings   Procedure  Indication: Menometrorhagia. Office sono noted thick endometrial stripe, failed medical therapy with progestins and OCs. Patient was counseled on risks/ complications including infection, bleeding, damage to internal organs, she understood and agrees, gave informed written consent.  Patient was brought to the operating room with IV running. Time out was carried out.  She underwent MAC anesthesia. She was given dorsolithotomy position. Parts were prepped and draped in standard fashion. Bimanual exam revealed uterus to be anteverted and normal size. Speculum was placed and cervix was grasped with single-tooth tenaculum. Cervical block with 20 cc 1% plain Xylocaine given. The uterus was sounded to 7 cm. Cervical os was dilated to 21 [place] dilator. Hysteroscope was introduced in the uterine cavity under vision, using Glycine for irrigation. Findings: Thick endometrial tissue with bleeding noted. Irrigation performed. Hysteroscope was removed. Endometrial curettage was performed with sharp curette. All tissue sent to path. Hysteroscope reintroduced and cavity was more clear and hemostasis noted.  Fluid deficit 80 cc.  All counts are correct x2. No complications. Patient was made supine dorsal anesthesia and brought to the recovery room in stable condition.  Patient will be discharged home today. Discharge meds Doxy, Ibuprofen, Percocet. Follow up in 2 weeks in office. Warning signs of infection and excessive bleeding reviewed.   V.Edman Lipsey, MD.

## 2013-07-28 NOTE — Anesthesia Preprocedure Evaluation (Addendum)
Anesthesia Evaluation  Patient identified by MRN, date of birth, ID band Patient awake    Reviewed: Allergy & Precautions, H&P , NPO status , Patient's Chart, lab work & pertinent test results, reviewed documented beta blocker date and time   History of Anesthesia Complications Negative for: history of anesthetic complications  Airway Mallampati: II TM Distance: >3 FB Neck ROM: full    Dental  (+) Teeth Intact and Dental Advisory Given   Pulmonary  H/o TB breath sounds clear to auscultation        Cardiovascular negative cardio ROS  Rhythm:regular Rate:Normal     Neuro/Psych PSYCHIATRIC DISORDERS (depression - no meds) Depression negative neurological ROS     GI/Hepatic negative GI ROS, Neg liver ROS,   Endo/Other  diabetes, Type 2, Oral Hypoglycemic AgentsHypothyroidism PCOS  Renal/GU negative Renal ROS     Musculoskeletal   Abdominal   Peds  Hematology negative hematology ROS (+) anemia ,   Anesthesia Other Findings   Reproductive/Obstetrics negative OB ROS                         Anesthesia Physical  Anesthesia Plan  ASA: II and emergent  Anesthesia Plan: MAC   Post-op Pain Management:    Induction: Intravenous  Airway Management Planned: LMA  Additional Equipment:   Intra-op Plan:   Post-operative Plan: Extubation in OR  Informed Consent: I have reviewed the patients History and Physical, chart, labs and discussed the procedure including the risks, benefits and alternatives for the proposed anesthesia with the patient or authorized representative who has indicated his/her understanding and acceptance.   Dental advisory given  Plan Discussed with: CRNA  Anesthesia Plan Comments:       Anesthesia Quick Evaluation

## 2013-07-28 NOTE — Transfer of Care (Signed)
Immediate Anesthesia Transfer of Care Note  Patient: Veronica Kelly  Procedure(s) Performed: Procedure(s): DILATATION AND CURETTAGE /HYSTEROSCOPY (N/A)  Patient Location: PACU  Anesthesia Type:MAC  Level of Consciousness: awake and oriented  Airway & Oxygen Therapy: Patient Spontanous Breathing  Post-op Assessment: Report given to PACU RN  Post vital signs: Reviewed and stable  Complications: No apparent anesthesia complications

## 2013-07-28 NOTE — H&P (Signed)
Veronica Kelly is an 37 y.o. female with menorrhagia not controlled with medications is here for D&C. Anovulatory bleeding with thick endometrial stripe, tried progestin, tried OCs, bleeding and EMS getting worse. C/o dizziness and pain.   Patient's last menstrual period was 07/27/2013.    Past Medical History  Diagnosis Date  . PCOS (polycystic ovarian syndrome)   . Depression   . Type 2 diabetes mellitus   . Menorrhagia   . Palpitation   . History of positive PPD     treated  at age 43  . Anemia   . Hypothyroidism     Past Surgical History  Procedure Laterality Date  . Cesarean section N/A 12/15/2012    Procedure: CESAREAN SECTION;  Surgeon: Robley Fries, MD;  Location: [facility];  Service: Obstetrics;  Laterality: N/A;    History reviewed. No pertinent family history.  Social History:  reports that she has never smoked. She has never used smokeless tobacco. She reports that she does not drink alcohol or use illicit drugs.  Allergies:  Allergies  Allergen Reactions  . Adhesive [Tape] Rash  . Latex Hives    Prescriptions prior to admission  Medication Sig Dispense Refill  . ferrous sulfate 325 (65 FE) MG tablet Take 325 mg by mouth daily with breakfast.      . levothyroxine (SYNTHROID, LEVOTHROID) 100 MCG tablet Take 100 mcg by mouth daily before breakfast.       . metFORMIN (GLUCOPHAGE) 500 MG tablet Take by mouth 2 (two) times daily with a meal.      . Multiple Vitamins-Minerals (CENTRUM PO) Take 1 tablet by mouth daily.      . norgestimate-ethinyl estradiol (ORTHO-CYCLEN,SPRINTEC,PREVIFEM) 0.25-35 MG-MCG tablet Take 1 tablet by mouth daily.      . Vitamin D, Ergocalciferol, (DRISDOL) 50000 UNITS CAPS capsule Take 50,000 Units by mouth every 7 (seven) days.      Marland Kitchen ibuprofen (ADVIL,MOTRIN) 600 MG tablet Take 1 tablet (600 mg total) by mouth every 6 (six) hours.  30 tablet  0    ROS pelvic pain  Blood pressure 117/72, pulse 87, temperature 97 F (36.1 C), temperature  source Oral, resp. rate 16, height 5\' 4"  (1.626 m), weight 209 lb (94.802 kg), last menstrual period 07/27/2013, SpO2 98.00%, not currently breastfeeding. Physical Exam  A&O x 3, no acute distress. Pleasant HEENT neg, no thyromegaly Lungs CTA bilat CV RRR, S1S2 normal Abdo soft, non tender, non acute Extr no edema/ tenderness Pelvic Uterus normal size, no adnexal masses  Results for orders placed during the hospital encounter of 07/28/13 (from the past 24 hour(s))  BASIC METABOLIC PANEL     Status: None   Collection Time    07/28/13 11:00 AM      Result Value Range   Sodium 135  135 - 145 mEq/L   Potassium 4.0  3.5 - 5.1 mEq/L   Chloride 103  96 - 112 mEq/L   CO2 21  19 - 32 mEq/L   Glucose, Bld 87  70 - 99 mg/dL   BUN 8  6 - 23 mg/dL   Creatinine, Ser 4.09  0.50 - 1.10 mg/dL   Calcium 9.2  8.4 - 81.1 mg/dL   GFR calc non Af Amer >90  >90 mL/min   GFR calc Af Amer >90  >90 mL/min  POCT PREGNANCY, URINE     Status: None   Collection Time    07/28/13 11:16 AM      Result Value Range  Preg Test, Ur NEGATIVE  NEGATIVE    No results found.  Assessment/Plan: Postpartum/ anovulatory menorrhagia. Here for [facility] and possible hysteroscopy. This is a temporary step and pt understands she will continue OCs for few months or until ready for next pregnancy/  Risks/complications of surgery reviewed incl infection, bleeding, damage to internal organs including bladder, bowels, ureters, blood vessels, other risks from anesthesia, VTE and delayed complications of any surgery, complications in future surgery reviewed.    Ayo Guarino R 07/28/2013, 12:32 PM

## 2013-07-31 ENCOUNTER — Encounter (HOSPITAL_BASED_OUTPATIENT_CLINIC_OR_DEPARTMENT_OTHER): Payer: Self-pay | Admitting: Obstetrics & Gynecology

## 2013-07-31 LAB — GLUCOSE, CAPILLARY: Glucose-Capillary: 65 mg/dL — ABNORMAL LOW (ref 70–99)

## 2014-06-18 ENCOUNTER — Encounter (HOSPITAL_BASED_OUTPATIENT_CLINIC_OR_DEPARTMENT_OTHER): Payer: Self-pay | Admitting: Obstetrics & Gynecology
